# Patient Record
Sex: Male | Born: 1960 | Race: White | Hispanic: No | Marital: Married | Smoking: Never smoker
Health system: Southern US, Community
[De-identification: ages and names within clinical notes are randomized; demographics above are authoritative.]

## PROBLEM LIST (undated history)

## (undated) DIAGNOSIS — Z9889 Other specified postprocedural states: Secondary | ICD-10-CM

## (undated) DIAGNOSIS — F419 Anxiety disorder, unspecified: Secondary | ICD-10-CM

## (undated) DIAGNOSIS — I2510 Atherosclerotic heart disease of native coronary artery without angina pectoris: Secondary | ICD-10-CM

## (undated) DIAGNOSIS — G4730 Sleep apnea, unspecified: Secondary | ICD-10-CM

## (undated) DIAGNOSIS — I10 Essential (primary) hypertension: Secondary | ICD-10-CM

## (undated) DIAGNOSIS — E669 Obesity, unspecified: Secondary | ICD-10-CM

## (undated) DIAGNOSIS — M1990 Unspecified osteoarthritis, unspecified site: Secondary | ICD-10-CM

## (undated) DIAGNOSIS — E7800 Pure hypercholesterolemia, unspecified: Secondary | ICD-10-CM

## (undated) DIAGNOSIS — C801 Malignant (primary) neoplasm, unspecified: Secondary | ICD-10-CM

## (undated) DIAGNOSIS — R112 Nausea with vomiting, unspecified: Secondary | ICD-10-CM

## (undated) HISTORY — DX: Obesity, unspecified: E66.9

## (undated) HISTORY — DX: Essential (primary) hypertension: I10

## (undated) HISTORY — DX: Malignant (primary) neoplasm, unspecified: C80.1

## (undated) HISTORY — DX: Pure hypercholesterolemia, unspecified: E78.00

## (undated) HISTORY — DX: Sleep apnea, unspecified: G47.30

## (undated) HISTORY — PX: BARIATRIC SURGERY: SHX1103

## (undated) HISTORY — DX: Unspecified osteoarthritis, unspecified site: M19.90

## (undated) HISTORY — DX: Anxiety disorder, unspecified: F41.9

---

## 2003-01-06 HISTORY — PX: CARDIAC SURGERY: SHX584

## 2003-04-06 ENCOUNTER — Ambulatory Visit (HOSPITAL_COMMUNITY): Admission: RE | Admit: 2003-04-06 | Discharge: 2003-04-07 | Payer: Self-pay | Admitting: Cardiology

## 2004-10-28 ENCOUNTER — Ambulatory Visit (HOSPITAL_COMMUNITY): Admission: RE | Admit: 2004-10-28 | Discharge: 2004-10-28 | Payer: Self-pay | Admitting: *Deleted

## 2004-10-28 ENCOUNTER — Ambulatory Visit: Payer: Self-pay | Admitting: *Deleted

## 2004-10-30 ENCOUNTER — Ambulatory Visit: Payer: Self-pay | Admitting: Cardiology

## 2004-10-31 ENCOUNTER — Inpatient Hospital Stay (HOSPITAL_BASED_OUTPATIENT_CLINIC_OR_DEPARTMENT_OTHER): Admission: RE | Admit: 2004-10-31 | Discharge: 2004-10-31 | Payer: Self-pay | Admitting: Cardiology

## 2004-11-20 ENCOUNTER — Ambulatory Visit: Payer: Self-pay | Admitting: Cardiology

## 2008-09-05 DIAGNOSIS — I10 Essential (primary) hypertension: Secondary | ICD-10-CM | POA: Insufficient documentation

## 2008-09-05 DIAGNOSIS — I2510 Atherosclerotic heart disease of native coronary artery without angina pectoris: Secondary | ICD-10-CM | POA: Insufficient documentation

## 2008-09-05 DIAGNOSIS — E785 Hyperlipidemia, unspecified: Secondary | ICD-10-CM | POA: Insufficient documentation

## 2008-09-05 DIAGNOSIS — I499 Cardiac arrhythmia, unspecified: Secondary | ICD-10-CM | POA: Insufficient documentation

## 2014-10-17 ENCOUNTER — Ambulatory Visit (INDEPENDENT_AMBULATORY_CARE_PROVIDER_SITE_OTHER): Payer: BLUE CROSS/BLUE SHIELD | Admitting: *Deleted

## 2014-10-17 DIAGNOSIS — Z23 Encounter for immunization: Secondary | ICD-10-CM | POA: Diagnosis not present

## 2016-08-26 ENCOUNTER — Ambulatory Visit (INDEPENDENT_AMBULATORY_CARE_PROVIDER_SITE_OTHER): Payer: BLUE CROSS/BLUE SHIELD | Admitting: Orthopedic Surgery

## 2016-08-26 ENCOUNTER — Encounter (INDEPENDENT_AMBULATORY_CARE_PROVIDER_SITE_OTHER): Payer: Self-pay | Admitting: Orthopedic Surgery

## 2016-08-26 VITALS — BP 130/80 | HR 95 | Resp 12 | Ht 74.0 in | Wt >= 6400 oz

## 2016-08-26 DIAGNOSIS — M1712 Unilateral primary osteoarthritis, left knee: Secondary | ICD-10-CM | POA: Diagnosis not present

## 2016-08-26 DIAGNOSIS — M25562 Pain in left knee: Secondary | ICD-10-CM | POA: Diagnosis not present

## 2016-08-26 NOTE — Progress Notes (Signed)
Office Visit Note   Patient: Nathan Pope           Date of Birth: August 17, 1960           MRN: [id number] Visit Date: 08/26/2016              Requested by: No referring provider defined for this encounter. PCP: No primary care provider on file.   Assessment & Plan: Visit Diagnoses:  1. Acute pain of left knee   2. Unilateral primary osteoarthritis, left knee     Plan: #1: Corticosteroid injection to the left knee accomplished #2: Physical therapy as per protocol and strengthening #3: Return in 3 weeks and if he is still symptomatic plan for MRI scan after 09/26/2016.  Follow-Up Instructions: Return in about 3 weeks (around 09/16/2016).   Orders:  No orders of the defined types were placed in this encounter.  No orders of the defined types were placed in this encounter.     Procedures: Large Joint Inj Date/Time: 08/26/2016 7:01 PM Performed by: Biagio Borg D Authorized by: Biagio Borg D   Consent Given by:  Patient Timeout: prior to procedure the correct patient, procedure, and site was verified   Indications:  Pain and joint swelling Location:  Knee Site:  L knee Prep: patient was prepped and draped in usual sterile fashion   Needle Size:  25 G Needle Length:  1.5 inches Approach:  Anteromedial Ultrasound Guidance: No   Fluoroscopic Guidance: No   Arthrogram: No   Medications:  80 mg methylPREDNISolone acetate 40 MG/ML; 3 mL bupivacaine 0.5 %; 3 mL lidocaine 1 % Aspiration Attempted: No   Patient tolerance:  Patient tolerated the procedure well with no immediate complications     Clinical Data: No additional findings.   Subjective: Chief Complaint  Patient presents with  . Left Knee - Pain    Nathan Pope is a 56 y o that presents with chronic Left knee and a week ago he was climbing stairs heard/felt a "pop". [facility], XR obtained, pain meds for 3 days. Pain is noticeably better than last week.    Nathan Pope is a very pleasant 56 year old  white male who presents today with left knee pain. Apparently he has been told that he did have osteoarthritis in the knee. He was seen in August 8 by his local physician who told him he had osteoarthritis and placed him on a 6 to a prednisone Dosepak. Premature at good resolution of his symptoms. The next week on August 15 he was at work going up a flight of stairs when he is getting up to the last step and he felt a pop in his knee with rather excruciating pain. He went to the emergency room at [facility] where x-rays were also performed there was noted to have tricompartmental osteoarthritis with a small joint effusion with no acute osseous findings. Apparently an MRI was requested however his insurance company stated that he was unable to have an MRI to September 22. He does state that his knee pain is better than it was last week but still present. He is seen today for evaluation.        Review of Systems  Constitutional: Negative for fatigue.  HENT: Negative for hearing loss.   Respiratory: Negative for apnea, chest tightness and shortness of breath.   Cardiovascular: Negative for chest pain, palpitations and leg swelling.  Gastrointestinal: Negative for blood in stool, constipation and diarrhea.  Genitourinary: Negative for difficulty urinating.  Musculoskeletal: Negative for arthralgias, back pain, joint swelling, myalgias, neck pain and neck stiffness.  Neurological: Negative for weakness, numbness and headaches.  Hematological: Does not bruise/bleed easily.  Psychiatric/Behavioral: Negative for sleep disturbance. The patient is not nervous/anxious.      Objective: Vital Signs: Resp 12   Ht 6\' 2"  (1.88 m)   Wt (!) 400 lb (181.4 kg)   BMI 51.36 kg/m   Physical Exam  Constitutional: He is oriented to person, place, and time. He appears well-developed and well-nourished.  HENT:  Head: Normocephalic and atraumatic.  Eyes: Pupils are equal, round, and reactive to light. EOM  are normal.  Pulmonary/Chest: Effort normal.  Neurological: He is alert and oriented to person, place, and time.  Skin: Skin is warm and dry.  Psychiatric: He has a normal mood and affect. His behavior is normal. Judgment and thought content normal.    Ortho Exam Today he has range of motion from 0-110. A mild effusion. No warmth or erythema. He does have tenderness to palpation about the joint lines. Appears ligaments are stable. McMurray's does cause a little bit of pain in the knee. Smooth motion of the hip. Neurovascular intact distally.  Specialty Comments:  No specialty comments available.  Imaging: No results found.   PMFS History: Patient Active Problem List   Diagnosis Date Noted  . HYPERLIPIDEMIA 09/05/2008  . HYPERTENSION 09/05/2008  . CAD 09/05/2008  . IRREGULAR HEART RATE 09/05/2008   Past Medical History:  Diagnosis Date  . Arthritis   . Osteoarthritis     Family History  Problem Relation Age of Onset  . Diabetes Father   . Hypercalcemia Maternal Grandfather     Past Surgical History:  Procedure Laterality Date  . CARDIAC SURGERY  2005   stent   Social History   Occupational History  . Not on file.   Social History Main Topics  . Smoking status: Never Smoker  . Smokeless tobacco: Never Used  . Alcohol use 0.6 oz/week    1 Cans of beer per week  . Drug use: No  . Sexual activity: Not on file

## 2016-08-27 ENCOUNTER — Encounter (INDEPENDENT_AMBULATORY_CARE_PROVIDER_SITE_OTHER): Payer: Self-pay | Admitting: Orthopedic Surgery

## 2016-08-27 MED ORDER — LIDOCAINE HCL 1 % IJ SOLN
3.0000 mL | INTRAMUSCULAR | Status: AC | PRN
  Administered 2016-08-26: 3 mL

## 2016-08-27 MED ORDER — METHYLPREDNISOLONE ACETATE 40 MG/ML IJ SUSP
80.0000 mg | INTRAMUSCULAR | Status: AC | PRN
  Administered 2016-08-26: 80 mg

## 2016-08-27 MED ORDER — BUPIVACAINE HCL 0.5 % IJ SOLN
3.0000 mL | INTRAMUSCULAR | Status: AC | PRN
  Administered 2016-08-26: 3 mL via INTRA_ARTICULAR

## 2016-09-23 ENCOUNTER — Ambulatory Visit (INDEPENDENT_AMBULATORY_CARE_PROVIDER_SITE_OTHER): Payer: BLUE CROSS/BLUE SHIELD | Admitting: Orthopaedic Surgery

## 2016-09-30 ENCOUNTER — Ambulatory Visit (INDEPENDENT_AMBULATORY_CARE_PROVIDER_SITE_OTHER): Payer: BLUE CROSS/BLUE SHIELD | Admitting: Orthopaedic Surgery

## 2016-09-30 ENCOUNTER — Encounter (INDEPENDENT_AMBULATORY_CARE_PROVIDER_SITE_OTHER): Payer: Self-pay | Admitting: Orthopaedic Surgery

## 2016-09-30 VITALS — BP 126/82 | HR 84 | Resp 14 | Ht 74.0 in | Wt 395.0 lb

## 2016-09-30 DIAGNOSIS — G8929 Other chronic pain: Secondary | ICD-10-CM | POA: Diagnosis not present

## 2016-09-30 DIAGNOSIS — M25562 Pain in left knee: Secondary | ICD-10-CM | POA: Diagnosis not present

## 2016-09-30 NOTE — Progress Notes (Signed)
   Office Visit Note   Patient: Nathan Pope           Date of Birth: 1960/04/12           MRN: [id number] Visit Date: 09/30/2016              Requested by: No referring provider defined for this encounter. PCP: No primary care provider on file.   Assessment & Plan: Visit Diagnoses:  1. Chronic pain of left knee   osteoarthritis left knee. Good response to cortisone injection 68month ago  Plan: long discussion regarding diagnosis and teatment options over time. We've discussed follup cortisone injections, Visco supplementation exercises, weight loss and NSAIDs  Follow-Up Instructions: Return if symptoms worsen or fail to improve.   Orders:  No orders of the defined types were placed in this encounter.  No orders of the defined types were placed in this encounter.     Procedures: No procedures performed   Clinical Data: No additional findings.   Subjective: Chief Complaint  Patient presents with  . Left Knee - Pain    Nathan Pope is a 56 y o for a f/u of Left knee pain. Cortisone injection 08/26/16. Improvement with shot and PT.  eeling "a lot better" after cortisone injection left knee. Has been wearing kinetic tape  HPI  Review of Systems  Constitutional: Negative for fatigue.  HENT: Negative for hearing loss.   Respiratory: Negative for apnea, chest tightness and shortness of breath.   Cardiovascular: Negative for chest pain, palpitations and leg swelling.  Gastrointestinal: Negative for blood in stool, constipation and diarrhea.  Genitourinary: Negative for difficulty urinating.  Musculoskeletal: Negative for arthralgias, back pain, joint swelling, myalgias, neck pain and neck stiffness.  Neurological: Negative for weakness, numbness and headaches.  Hematological: Does not bruise/bleed easily.  Psychiatric/Behavioral: Negative for sleep disturbance. The patient is not nervous/anxious.      Objective: Vital Signs: BP 126/82   Pulse 84   Resp 14   Ht 6'  2" (1.88 m)   Wt (!) 395 lb (179.2 kg)   BMI 50.71 kg/m   Physical Exam  Ortho Examlarge knees. No effusion left knee. Predominantly medial joint pain that is "mild". Full extension and just over 100 of flexion. No popliteal pain or mass. No calf pain. No distal swelling. Neurovascular exam intact  Specialty Comments:  No specialty comments available.  Imaging: No results found.   PMFS History: Patient Active Problem List   Diagnosis Date Noted  . HYPERLIPIDEMIA 09/05/2008  . HYPERTENSION 09/05/2008  . CAD 09/05/2008  . IRREGULAR HEART RATE 09/05/2008   Past Medical History:  Diagnosis Date  . Arthritis   . Osteoarthritis     Family History  Problem Relation Age of Onset  . Diabetes Father   . Hypercalcemia Maternal Grandfather     Past Surgical History:  Procedure Laterality Date  . CARDIAC SURGERY  2005   stent   Social History   Occupational History  . Not on file.   Social History Main Topics  . Smoking status: Never Smoker  . Smokeless tobacco: Never Used  . Alcohol use 0.6 oz/week    1 Cans of beer per week  . Drug use: No  . Sexual activity: Not on file

## 2016-10-19 DIAGNOSIS — Z23 Encounter for immunization: Secondary | ICD-10-CM | POA: Diagnosis not present

## 2018-10-17 ENCOUNTER — Other Ambulatory Visit: Payer: Self-pay

## 2018-10-17 DIAGNOSIS — Z20822 Contact with and (suspected) exposure to covid-19: Secondary | ICD-10-CM

## 2018-10-18 LAB — NOVEL CORONAVIRUS, NAA: SARS-CoV-2, NAA: NOT DETECTED

## 2020-05-15 ENCOUNTER — Other Ambulatory Visit (HOSPITAL_COMMUNITY): Payer: Self-pay | Admitting: Surgery

## 2020-05-16 ENCOUNTER — Telehealth: Payer: Self-pay

## 2020-05-16 NOTE — Telephone Encounter (Signed)
NOTES ON FILE FROM  [facility] [phone], SENT REFERRAL TO SCHEDULING

## 2020-05-30 ENCOUNTER — Ambulatory Visit (HOSPITAL_COMMUNITY)
Admission: RE | Admit: 2020-05-30 | Discharge: 2020-05-30 | Disposition: A | Discharge status: Home / Self Care | Payer: BLUE CROSS/BLUE SHIELD | Source: Ambulatory Visit | Attending: Surgery | Admitting: Surgery

## 2020-06-04 ENCOUNTER — Ambulatory Visit (INDEPENDENT_AMBULATORY_CARE_PROVIDER_SITE_OTHER): Payer: BLUE CROSS/BLUE SHIELD | Admitting: Psychology

## 2020-06-04 DIAGNOSIS — F509 Eating disorder, unspecified: Secondary | ICD-10-CM | POA: Diagnosis not present

## 2020-06-04 NOTE — Progress Notes (Signed)
Cardiology Office Note:    Date:  06/10/2020   ID:  DARYN HICKS, DOB 1960-10-19, MRN [id number]  PCP:  Caryl Bis, MD   [facility] Providers Cardiologist:  None {  Referring MD: Felicie Morn, MD    History of Present Illness:    MADDYX WIECK is a 60 y.o. male with a hx of CAD with history of PCI in LAD, HTN, HLD, and obesity who was referred by Dr. Thermon Leyland for pre-operative evaluation prior to bariatric surgery.  Today, the patient states he feels well. He is planned for bariatric surgery and was hoping for cardiac clearance. Of note, he has a history of known CAD. Specifically, the patient states that in 2005 he was having worsening fatigue and SOB which prompted a stress test which revealed decreased perfusion anteriorly and he subsequently underwent coronary angiography with PCI in the LAD. Since that time he has been doing well with no recurrent chest pain. Has been tolerating medications without issues. Patient is active and able to walk 40 min per day without issues. Has difficulty with stairs due to knee pain, but can work in his yard for hours without needing to stop. Blood pressure well controlled on current medications. No HF symptoms.  Past Medical History:  Diagnosis Date  . Anxiety   . Arthritis   . Cancer ([place])   . Hypercholesterolemia   . Hypertension   . Obesity   . Osteoarthritis   . Sleep apnea     Past Surgical History:  Procedure Laterality Date  . CARDIAC SURGERY  2005   stent    Current Medications: Current Meds  Medication Sig  . aspirin EC 81 MG tablet Take 81 mg by mouth daily.  Marland Kitchen atorvastatin (LIPITOR) 80 MG tablet Take 80 mg by mouth daily.  Marland Kitchen CARTIA XT 240 MG 24 hr capsule Take 240 mg by mouth daily.  Marland Kitchen doxycycline (VIBRAMYCIN) 50 MG capsule Take 50 mg by mouth as needed.  . ezetimibe (ZETIA) 10 MG tablet Take 1 tablet (10 mg total) by mouth daily.  Marland Kitchen lisinopril (PRINIVIL,ZESTRIL) 20 MG tablet Take 20 mg by mouth  daily.     Allergies:   Patient has no known allergies.   Social History   Socioeconomic History  . Marital status: Married    Spouse name: Not on file  . Number of children: Not on file  . Years of education: Not on file  . Highest education level: Not on file  Occupational History  . Not on file  Tobacco Use  . Smoking status: Never Smoker  . Smokeless tobacco: Never Used  Vaping Use  . Vaping Use: Never used  Substance and Sexual Activity  . Alcohol use: Yes    Alcohol/week: 1.0 standard drink    Types: 1 Cans of beer per week  . Drug use: No  . Sexual activity: Not on file  Other Topics Concern  . Not on file  Social History Narrative  . Not on file   Social Determinants of Health   Financial Resource Strain: Not on file  Food Insecurity: Not on file  Transportation Needs: Not on file  Physical Activity: Not on file  Stress: Not on file  Social Connections: Not on file     Family History: The patient's family history includes Arthritis in his father; Diabetes in his father; Hypercalcemia in his maternal grandfather; Hypertension in his father.  ROS:   Please see the history of present illness.  Review of Systems  Constitutional: Negative for chills and fever.  HENT: Negative for sore throat.   Eyes: Negative for blurred vision.  Respiratory: Negative for shortness of breath.   Cardiovascular: Negative for chest pain, palpitations, orthopnea, claudication, leg swelling and PND.  Gastrointestinal: Negative for nausea and vomiting.  Genitourinary: Negative for hematuria.  Musculoskeletal: Positive for joint pain and myalgias.  Neurological: Negative for dizziness and loss of consciousness.  Psychiatric/Behavioral: Negative for substance abuse.    EKGs/Labs/Other Studies Reviewed:    The following studies were reviewed today: CXR 06/19/2020: FINDINGS: There is no evidence of acute infiltrate, pleural effusion or pneumothorax. The heart size and  mediastinal contours are within normal limits. There is moderate severity calcification of the aortic arch. Degenerative changes seen throughout the thoracic spine.  IMPRESSION: No active cardiopulmonary disease.  EKG:  EKG is  ordered today.  The ekg ordered today demonstrates NSR with HR 78  Recent Labs: No results found for requested labs within last 8760 hours.  Recent Lipid Panel No results found for: CHOL, TRIG, HDL, CHOLHDL, VLDL, LDLCALC, LDLDIRECT    Physical Exam:    VS:  BP 132/76   Pulse 78   Ht 6\' 2"  (1.88 m)   Wt (!) 416 lb 6.4 oz (188.9 kg)   SpO2 94%   BMI 53.46 kg/m     Wt Readings from Last 3 Encounters:  06/10/20 (!) 416 lb 6.4 oz (188.9 kg)  09/30/16 (!) 395 lb (179.2 kg)  08/26/16 (!) 400 lb (181.4 kg)     GEN:  Obese, comfortable HEENT: Normal NECK: No JVD; No carotid bruits CARDIAC: RRR, no murmurs, rubs, gallops RESPIRATORY:  Clear to auscultation without rales, wheezing or rhonchi  ABDOMEN: Obese, soft, non-tender, non-distended MUSCULOSKELETAL:  No edema; No deformity  SKIN: Warm and dry NEUROLOGIC:  Alert and oriented x 3 PSYCHIATRIC:  Normal affect   ASSESSMENT:    1. Pre-operative clearance   2. Medication management   3. Hyperlipidemia, unspecified hyperlipidemia type   4. Coronary artery disease involving native heart with angina pectoris, unspecified vessel or lesion type ([place])   5. Morbid obesity ([place])    PLAN:    In order of problems listed above:  #Pre-operative evaluation prior to bariatric surgery: Patient with history of CAD s/p PCI to LAD in 2005. No recurrent anginal symptoms. Patient is active and able to complete >4METs without symptoms with no indication for stress testing at this time. Walks everyday for 40 minutes. Denies chest pain, SOB, orthopnea, PND or LE edema. Compliant with medications and blood pressure well controlled. -No stress testing indicated prior to surgery -Continue ASA 81mg  daily -Continue  lipitor 80mg  daily -Start zetia 10mg  daily  #CAD s/p LAD PCI in 2005: Doing well with no anginal or HF symptoms. Compliant with all medications. -Check TTE for baseline as no TTE in our system post-PCI -Continue ASA 81mg  daily -Continue lipitor 80mg  daily -Continue lisinopril 20mg  daily -Start zetia 10mg  daily  #HTN: Well controlled and at goal <120/80s. -Continue dilt 240mg  daily -Continue lisinopril 20mg  daily  #HLD: LDL 94 which is above goal <70.  -Continue lipitor 80mg  daily -Start zetia 10mg  daily -Repeat lipids in 6 weeks  #Morbid Obesity with BMI 53: Planned for bariatric surgery as above. Continue diet and exercise. Patient very motivated to lose weight.  Medication Adjustments/Labs and Tests Ordered: Current medicines are reviewed at length with the patient today.  Concerns regarding medicines are outlined above.  Orders Placed This Encounter  Procedures  .  Lipid panel  . EKG 12-Lead  . ECHOCARDIOGRAM COMPLETE   Meds ordered this encounter  Medications  . ezetimibe (ZETIA) 10 MG tablet    Sig: Take 1 tablet (10 mg total) by mouth daily.    Dispense:  90 tablet    Refill:  2    Patient Instructions  Medication Instructions:   START TAKING ZETIA 10 MG BY MOUTH DAILY  *If you need a refill on your cardiac medications before your next appointment, please call your pharmacy*   Lab Work:  IN 6 [place] OFFICE--WILL [place] TIME--PLEASE COME FASTING TO THIS LAB APPOINTMENT  If you have labs (blood work) drawn today and your tests are completely normal, you will receive your results only by: Marland Kitchen MyChart Message (if you have MyChart) OR . A paper copy in the mail If you have any lab test that is abnormal or we need to change your treatment, we will call you to review the results.   Testing/Procedures:  Your physician has requested that you have an echocardiogram. Echocardiography is a painless test that uses sound waves to create  images of your heart. It provides your doctor with information about the size and shape of your heart and how well your heart's chambers and valves are working. This procedure takes approximately one hour. There are no restrictions for this procedure.   Follow-Up:  6-8 MONTHS IN THE OFFICE WITH DR. Johney Frame   Other Instructions  PLEASE HAVE YOUR BARIATRIC SURGEON FAX OUR OFFICE A CARDIAC/MEDICAL CLEARANCE FORM TO [phone] ATTN: DR. Staci Acosta THAT SHE CAN ADVISE ON THIS AND SEND BACK TO THEM FOR CLEARANCE OF SURGERY     Signed, Freada Bergeron, MD  06/10/2020 5:10 PM    [facility]

## 2020-06-10 ENCOUNTER — Other Ambulatory Visit: Payer: Self-pay

## 2020-06-10 ENCOUNTER — Encounter: Payer: Self-pay | Admitting: Cardiology

## 2020-06-10 ENCOUNTER — Ambulatory Visit: Payer: BLUE CROSS/BLUE SHIELD | Admitting: Cardiology

## 2020-06-10 VITALS — BP 132/76 | HR 78 | Ht 74.0 in | Wt >= 6400 oz

## 2020-06-10 DIAGNOSIS — I25119 Atherosclerotic heart disease of native coronary artery with unspecified angina pectoris: Secondary | ICD-10-CM

## 2020-06-10 DIAGNOSIS — Z79899 Other long term (current) drug therapy: Secondary | ICD-10-CM | POA: Diagnosis not present

## 2020-06-10 DIAGNOSIS — E785 Hyperlipidemia, unspecified: Secondary | ICD-10-CM

## 2020-06-10 DIAGNOSIS — Z01818 Encounter for other preprocedural examination: Secondary | ICD-10-CM

## 2020-06-10 MED ORDER — EZETIMIBE 10 MG PO TABS: 10.0000 mg | ORAL_TABLET | Freq: Every day | ORAL | 2 refills | Status: AC

## 2020-06-10 NOTE — Patient Instructions (Signed)
Medication Instructions:   START TAKING ZETIA 10 MG BY MOUTH DAILY  *If you need a refill on your cardiac medications before your next appointment, please call your pharmacy*   Lab Work:  IN [place] OFFICE--WILL [place] TIME--PLEASE COME FASTING TO THIS LAB APPOINTMENT  If you have labs (blood work) drawn today and your tests are completely normal, you will receive your results only by: Marland Kitchen MyChart Message (if you have MyChart) OR . A paper copy in the mail If you have any lab test that is abnormal or we need to change your treatment, we will call you to review the results.   Testing/Procedures:  Your physician has requested that you have an echocardiogram. Echocardiography is a painless test that uses sound waves to create images of your heart. It provides your doctor with information about the size and shape of your heart and how well your heart's chambers and valves are working. This procedure takes approximately one hour. There are no restrictions for this procedure.   Follow-Up:  6-8 MONTHS IN THE OFFICE WITH DR. Johney Frame   Other Instructions  PLEASE HAVE YOUR BARIATRIC SURGEON FAX OUR OFFICE A CARDIAC/MEDICAL CLEARANCE FORM TO [phone] ATTN: DR. Staci Acosta THAT SHE CAN ADVISE ON THIS AND SEND BACK TO THEM FOR CLEARANCE OF SURGERY

## 2020-06-25 ENCOUNTER — Other Ambulatory Visit: Payer: Self-pay

## 2020-06-25 ENCOUNTER — Ambulatory Visit: Payer: BLUE CROSS/BLUE SHIELD | Admitting: Psychology

## 2020-06-26 ENCOUNTER — Encounter: Payer: Self-pay | Admitting: Skilled Nursing Facility1

## 2020-06-26 ENCOUNTER — Encounter: Payer: BLUE CROSS/BLUE SHIELD | Attending: Surgery | Admitting: Skilled Nursing Facility1

## 2020-06-26 ENCOUNTER — Ambulatory Visit: Payer: Self-pay | Admitting: Skilled Nursing Facility1

## 2020-06-26 DIAGNOSIS — E669 Obesity, unspecified: Secondary | ICD-10-CM | POA: Insufficient documentation

## 2020-06-26 NOTE — Progress Notes (Signed)
Nutrition Assessment for Bariatric Surgery Medical Nutrition Therapy Appt Start Time: 2:30    End Time: 3:33  Patient was seen on 06/26/2020 for Pre-Operative Nutrition Assessment. Letter of approval faxed to [facility] bariatric surgery program coordinator on 06/26/2020.   Referral stated Supervised Weight Loss (SWL) visits needed: 0  Pt completed visits.   Pt has cleared nutrition requirements.     Planned surgery: sleeve gastrectomy  Pt expectation of surgery: top lose weight Pt expectation of dietitian: none identified     NUTRITION ASSESSMENT   Anthropometrics  Start weight at [facility]: 420 lbs (date: 06/26/2020)  Height: 74 in BMI: 54.04 kg/m2     Clinical  Medical hx: HTN, hyperlipidemia, sleep apnea Medications: see list  Labs:  Notable signs/symptoms:  Any previous deficiencies? No  Micronutrient Nutrition Focused Physical Exam: Hair: No issues observed Eyes: No issues observed Mouth: No issues observed Neck: No issues observed Nails: No issues observed Skin: No issues observed  Lifestyle & Dietary Hx  Pt arrives with the use of a cane due to recently twisting his knee.  pt states he wears his C-PAP every night.  Pt states he has been doing PT for his knee.  Pt states he has been working on reducing his carb intake and trying to be aware of his food choices.  Pt states he has a wonderful support system at home.   24-Hr Dietary Recall First Meal: cereal + 2 banana  Snack:  Second Meal: beans  Snack:  Third Meal: 3 eggs + 3 bacon + 2 toast + butter + tomato + salt + pepper Snack: ice cream twice a week Beverages: water, coffee + sugar free creamer, water + flavoring   Estimated Energy Needs Calories: 1800   NUTRITION DIAGNOSIS  Overweight/obesity ([place]-3.3) related to past poor dietary habits and physical inactivity as evidenced by patient w/ planned sleeve gastrectomy surgery following dietary guidelines for continued weight loss.     NUTRITION INTERVENTION  Nutrition counseling (C-1) and education (E-2) to facilitate bariatric surgery goals.  Educated pt on micronutrient deficiencies post surgery and strategies to mitigate that risk   Pre-Op Goals Reviewed with the Patient Track food and beverage intake (pen and paper, MyFitness Pal, Baritastic app, etc.) Make healthy food choices while monitoring portion sizes Consume 3 meals per day or try to eat every 3-5 hours Avoid concentrated sugars and fried foods Keep sugar & fat in the single digits per serving on food labels Practice CHEWING your food (aim for applesauce consistency) Practice not drinking 15 minutes before, during, and 30 minutes after each meal and snack Avoid all carbonated beverages (ex: soda, sparkling beverages)  Limit caffeinated beverages (ex: coffee, tea, energy drinks) Avoid all sugar-sweetened beverages (ex: regular soda, sports drinks)  Avoid alcohol  Aim for 64-100 ounces of FLUID daily (with at least half of fluid intake being plain water)  Aim for at least 60-80 grams of PROTEIN daily Look for a liquid protein source that contains ?15 g protein and ?5 g carbohydrate (ex: shakes, drinks, shots) Make a list of non-food related activities Physical activity is an important part of a healthy lifestyle so keep it moving! The goal is to reach 150 minutes of exercise per week, including cardiovascular and weight baring activity.  *Goals that are bolded indicate the pt would like to start working towards these  Handouts Provided Include  Bariatric Surgery handouts (Nutrition Visits, Pre-Op Goals, Protein Shakes, Vitamins & Minerals)  Learning Style & Readiness for Change Teaching  method utilized: Optician, dispensing  Demonstrated degree of understanding via: Teach Back  Readiness Level: Action Barriers to learning/adherence to lifestyle change: none identified       MONITORING & EVALUATION Dietary intake, weekly physical activity, body  weight, and pre-op goals reached at next nutrition visit.    Next Steps  Patient is to follow up at [facility] for Pre-Op Class >2 weeks before surgery for further nutrition education.   Pt has completed visits. No further supervised visits required/recomended

## 2020-07-09 ENCOUNTER — Ambulatory Visit (HOSPITAL_COMMUNITY): Payer: BLUE CROSS/BLUE SHIELD | Attending: Cardiology

## 2020-07-09 ENCOUNTER — Other Ambulatory Visit (HOSPITAL_COMMUNITY): Payer: BLUE CROSS/BLUE SHIELD

## 2020-07-09 ENCOUNTER — Other Ambulatory Visit: Payer: Self-pay

## 2020-07-09 DIAGNOSIS — Z01810 Encounter for preprocedural cardiovascular examination: Secondary | ICD-10-CM

## 2020-07-09 DIAGNOSIS — I25119 Atherosclerotic heart disease of native coronary artery with unspecified angina pectoris: Secondary | ICD-10-CM | POA: Insufficient documentation

## 2020-07-09 DIAGNOSIS — Z01818 Encounter for other preprocedural examination: Secondary | ICD-10-CM | POA: Diagnosis not present

## 2020-07-09 LAB — ECHOCARDIOGRAM COMPLETE
Area-P 1/2: 3.27 cm2
S' Lateral: 3.85 cm

## 2020-07-09 MED ORDER — PERFLUTREN LIPID MICROSPHERE
1.0000 mL | INTRAVENOUS | Status: AC | PRN
  Administered 2020-07-09: 1 mL via INTRAVENOUS

## 2020-07-12 ENCOUNTER — Ambulatory Visit: Payer: Self-pay | Admitting: Surgery

## 2020-07-22 ENCOUNTER — Other Ambulatory Visit: Payer: Self-pay

## 2020-07-22 ENCOUNTER — Other Ambulatory Visit: Payer: BLUE CROSS/BLUE SHIELD | Admitting: *Deleted

## 2020-07-22 DIAGNOSIS — I25119 Atherosclerotic heart disease of native coronary artery with unspecified angina pectoris: Secondary | ICD-10-CM

## 2020-07-22 DIAGNOSIS — Z79899 Other long term (current) drug therapy: Secondary | ICD-10-CM

## 2020-07-22 DIAGNOSIS — Z01818 Encounter for other preprocedural examination: Secondary | ICD-10-CM

## 2020-07-22 DIAGNOSIS — E785 Hyperlipidemia, unspecified: Secondary | ICD-10-CM

## 2020-07-22 LAB — LIPID PANEL
Chol/HDL Ratio: 3.5 ratio (ref 0.0–5.0)
Cholesterol, Total: 118 mg/dL (ref 100–199)
HDL: 34 mg/dL — ABNORMAL LOW (ref >39)
LDL Chol Calc (NIH): 59 mg/dL (ref 0–99)
Triglycerides: 144 mg/dL (ref 0–149)
VLDL Cholesterol Cal: 25 mg/dL (ref 5–40)

## 2020-07-25 ENCOUNTER — Ambulatory Visit: Payer: BLUE CROSS/BLUE SHIELD | Admitting: Cardiology

## 2020-08-09 ENCOUNTER — Encounter (HOSPITAL_COMMUNITY)
Admission: RE | Disposition: A | Discharge status: Home / Self Care | Payer: Self-pay | Source: Home / Self Care | Attending: Surgery

## 2020-08-09 ENCOUNTER — Ambulatory Visit (HOSPITAL_COMMUNITY)
Admission: RE | Admit: 2020-08-09 | Discharge: 2020-08-09 | Disposition: A | Discharge status: Home / Self Care | Payer: BLUE CROSS/BLUE SHIELD | Attending: Surgery | Admitting: Surgery

## 2020-08-09 ENCOUNTER — Ambulatory Visit (HOSPITAL_COMMUNITY): Payer: BLUE CROSS/BLUE SHIELD | Admitting: Certified Registered Nurse Anesthetist

## 2020-08-09 ENCOUNTER — Other Ambulatory Visit: Payer: Self-pay

## 2020-08-09 ENCOUNTER — Encounter (HOSPITAL_COMMUNITY): Payer: Self-pay | Admitting: Surgery

## 2020-08-09 DIAGNOSIS — K319 Disease of stomach and duodenum, unspecified: Secondary | ICD-10-CM | POA: Diagnosis not present

## 2020-08-09 DIAGNOSIS — Z79899 Other long term (current) drug therapy: Secondary | ICD-10-CM | POA: Diagnosis not present

## 2020-08-09 DIAGNOSIS — Z8249 Family history of ischemic heart disease and other diseases of the circulatory system: Secondary | ICD-10-CM | POA: Insufficient documentation

## 2020-08-09 DIAGNOSIS — Z7982 Long term (current) use of aspirin: Secondary | ICD-10-CM | POA: Diagnosis not present

## 2020-08-09 DIAGNOSIS — G4730 Sleep apnea, unspecified: Secondary | ICD-10-CM | POA: Diagnosis not present

## 2020-08-09 DIAGNOSIS — Z6841 Body Mass Index (BMI) 40.0 and over, adult: Secondary | ICD-10-CM | POA: Insufficient documentation

## 2020-08-09 DIAGNOSIS — K2980 Duodenitis without bleeding: Secondary | ICD-10-CM | POA: Diagnosis not present

## 2020-08-09 DIAGNOSIS — Z955 Presence of coronary angioplasty implant and graft: Secondary | ICD-10-CM | POA: Diagnosis not present

## 2020-08-09 DIAGNOSIS — E7800 Pure hypercholesterolemia, unspecified: Secondary | ICD-10-CM | POA: Diagnosis not present

## 2020-08-09 DIAGNOSIS — I10 Essential (primary) hypertension: Secondary | ICD-10-CM | POA: Diagnosis not present

## 2020-08-09 HISTORY — PX: ESOPHAGOGASTRODUODENOSCOPY: SHX5428

## 2020-08-09 HISTORY — PX: BIOPSY: SHX5522

## 2020-08-09 SURGERY — EGD (ESOPHAGOGASTRODUODENOSCOPY)
Anesthesia: Monitor Anesthesia Care

## 2020-08-09 MED ORDER — LIDOCAINE 2% (20 MG/ML) 5 ML SYRINGE
INTRAMUSCULAR | Status: DC | PRN
  Administered 2020-08-09: 50 mg via INTRAVENOUS

## 2020-08-09 MED ORDER — LACTATED RINGERS IV SOLN: INTRAVENOUS | Status: DC

## 2020-08-09 MED ORDER — PHENYLEPHRINE 40 MCG/ML (10ML) SYRINGE FOR IV PUSH (FOR BLOOD PRESSURE SUPPORT)
PREFILLED_SYRINGE | INTRAVENOUS | Status: DC | PRN
  Administered 2020-08-09 (×4): 120 ug via INTRAVENOUS

## 2020-08-09 MED ORDER — PROPOFOL 500 MG/50ML IV EMUL
INTRAVENOUS | Status: DC | PRN
  Administered 2020-08-09: 125 ug/kg/min via INTRAVENOUS

## 2020-08-09 MED ORDER — PROPOFOL 10 MG/ML IV BOLUS
INTRAVENOUS | Status: DC | PRN
  Administered 2020-08-09 (×2): 30 mg via INTRAVENOUS
  Administered 2020-08-09: 20 mg via INTRAVENOUS

## 2020-08-09 MED ORDER — PANTOPRAZOLE SODIUM 40 MG PO TBEC: 40.0000 mg | DELAYED_RELEASE_TABLET | Freq: Every day | ORAL | 3 refills | Status: DC

## 2020-08-09 MED ORDER — LACTATED RINGERS IV SOLN: INTRAVENOUS | Status: DC | PRN

## 2020-08-09 NOTE — Discharge Instructions (Signed)
YOU HAD AN ENDOSCOPIC PROCEDURE TODAY: Refer to the procedure report and other information in the discharge instructions given to you for any specific questions about what was found during the examination. If this information does not answer your questions, please call [facility] office at [phone] to clarify.   YOU SHOULD EXPECT: Some feelings of bloating in the abdomen. Passage of more gas than usual. Walking can help get rid of the air that was put into your GI tract during the procedure and reduce the bloating. If you had a lower endoscopy (such as a colonoscopy or flexible sigmoidoscopy) you may notice spotting of blood in your stool or on the toilet paper. Some abdominal soreness may be present for a day or two, also.  DIET: Your first meal following the procedure should be a light meal and then it is ok to progress to your normal diet. A half-sandwich or bowl of soup is an example of a good first meal. Heavy or fried foods are harder to digest and may make you feel nauseous or bloated. Drink plenty of fluids but you should avoid alcoholic beverages for 24 hours. If you had a esophageal dilation, please see attached instructions for diet.    ACTIVITY: Your care partner should take you home directly after the procedure. You should plan to take it easy, moving slowly for the rest of the day. You can resume normal activity the day after the procedure however YOU SHOULD NOT DRIVE, use power tools, machinery or perform tasks that involve climbing or major physical exertion for 24 hours (because of the sedation medicines used during the test).   SYMPTOMS TO REPORT IMMEDIATELY: A gastroenterologist can be reached at any hour. Please call [phone]  for any of the following symptoms:  Following lower endoscopy (colonoscopy, flexible sigmoidoscopy) Excessive amounts of blood in the stool  Significant tenderness, worsening of abdominal pains  Swelling of the abdomen that is new, acute   Fever of 100 or higher  Following upper endoscopy (EGD, EUS, ERCP, esophageal dilation) Vomiting of blood or coffee ground material  New, significant abdominal pain  New, significant chest pain or pain under the shoulder blades  Painful or persistently difficult swallowing  New shortness of breath  Black, tarry-looking or red, bloody stools  FOLLOW UP:  If any biopsies were taken you will be contacted by phone or by letter within the next 1-3 weeks. Call [phone]  if you have not heard about the biopsies in 3 weeks.  Please also call with any specific questions about appointments or follow up tests.

## 2020-08-09 NOTE — Transfer of Care (Signed)
Immediate Anesthesia Transfer of Care Note  Patient: Nathan Pope  Procedure(s) Performed: ESOPHAGOGASTRODUODENOSCOPY (EGD) BIOPSY  Patient Location: PACU  Anesthesia Type:MAC  Level of Consciousness: awake, alert , oriented and patient cooperative  Airway & Oxygen Therapy: Patient Spontanous Breathing and Patient connected to face mask  Post-op Assessment: Report given to RN and Post -op Vital signs reviewed and stable  Post vital signs: Reviewed and stable  Last Vitals:  Vitals Value Taken Time  BP 107/50 08/09/20 0908  Temp    Pulse 72 08/09/20 0909  Resp 20 08/09/20 0909  SpO2 96 % 08/09/20 0909  Vitals shown include unvalidated device data.  Last Pain:  Vitals:   08/09/20 0906  PainSc: 0-No pain         Complications: No notable events documented.

## 2020-08-09 NOTE — Op Note (Signed)
[facility] Patient Name: Nathan Pope Procedure Date: 08/09/2020 MRN: [id number] Attending MD: Felicie Morn ,  Date of Birth: 1960/12/25 CSN: [id number] Age: 60 Admit Type: Outpatient Procedure:                Upper GI endoscopy Indications:              Obesity due to excess calories Providers:                Felicie Morn, Carlyn Reichert, RN, Cherylynn Ridges, Technician, Dellie Catholic Referring MD:              Medicines:                Monitored Anesthesia Care Complications:            No immediate complications. Estimated Blood Loss:     Estimated blood loss: none. Procedure:                Pre-Anesthesia Assessment:                           - Prior to the procedure, a History and Physical                            was performed, and patient medications and                            allergies were reviewed. The patient is competent.                            The risks and benefits of the procedure and the                            sedation options and risks were discussed with the                            patient. All questions were answered and informed                            consent was obtained. Patient identification and                            proposed procedure were verified in the                            pre-procedure area. Mental Status Examination:                            alert and oriented. CV Examination: normal. ASA                            Grade Assessment: II - A patient with mild systemic  disease. After reviewing the risks and benefits,                            the patient was deemed in satisfactory condition to                            undergo the procedure. The anesthesia plan was to                            use monitored anesthesia care (MAC). Immediately                            prior to administration of medications, the patient                             was re-assessed for adequacy to receive sedatives.                            The heart rate, respiratory rate, oxygen                            saturations, blood pressure, adequacy of pulmonary                            ventilation, and response to care were monitored                            throughout the procedure. The physical status of                            the patient was re-assessed after the procedure.                           After obtaining informed consent, the endoscope was                            passed under direct vision. Throughout the                            procedure, the patient's blood pressure, pulse, and                            oxygen saturations were monitored continuously. The                            [id number]-H190 ([id number] Olympus endoscope was introduced                            through the mouth, and advanced to the second part                            of duodenum. The upper GI endoscopy was  accomplished without difficulty. The patient                            tolerated the procedure well. Scope In: Scope Out: Findings:      The examined esophagus was normal.      The gastroesophageal flap valve was visualized endoscopically and       classified as Hill Grade I (prominent fold, tight to endoscope).      The entire examined stomach was normal. Biopsies were taken with a cold       forceps for Helicobacter pylori testing. Verification of patient       identification for the specimen was done. Estimated blood loss was       minimal.      Diffuse moderate inflammation was found in the duodenal bulb. Biopsies       were taken with a cold forceps for histology. Impression:               - Normal esophagus.                           - Normal stomach. Biopsied.                           - Duodenitis. Biopsied. Moderate Sedation:      Moderate (conscious) sedation was personally administered by an        anesthesia professional. The following parameters were monitored: oxygen       saturation, heart rate, blood pressure, and response to care. Total       physician intraservice time was 15 minutes. Recommendation:           - Await pathology results.                           - Patient's anatomy appears appropriate for sleeve                            gastrectomy. I recommend starting a proton pump                            inhibitor prior to surgery due to the duodenitis                            found on today's EGD. I will remind the patient                            that he will need to avoid NSAIDS while healing                            from the sleeve gastrectomy, and he will continue                            protonix for at least 6 months. Procedure Code(s):        --- Professional ---                           [phone], Esophagogastroduodenoscopy, flexible,  transoral; with biopsy, single or multiple Diagnosis Code(s):        --- Professional ---                           K29.80, Duodenitis without bleeding                           E66.09, Other obesity due to excess calories CPT copyright 2019 American Medical Association. All rights reserved. The codes documented in this report are preliminary and upon coder review may  be revised to meet current compliance requirements. [place],  08/09/2020 9:14:25 AM This report has been signed electronically. Number of Addenda: 0

## 2020-08-09 NOTE — Anesthesia Postprocedure Evaluation (Signed)
Anesthesia Post Note  Patient: Nathan Pope  Procedure(s) Performed: ESOPHAGOGASTRODUODENOSCOPY (EGD) BIOPSY     Patient location during evaluation: PACU Anesthesia Type: MAC Level of consciousness: awake and alert Pain management: pain level controlled Vital Signs Assessment: post-procedure vital signs reviewed and stable Respiratory status: spontaneous breathing, nonlabored ventilation and respiratory function stable Cardiovascular status: blood pressure returned to baseline and stable Postop Assessment: no apparent nausea or vomiting Anesthetic complications: no   No notable events documented.  Last Vitals:  Vitals:   08/09/20 0916 08/09/20 0926  BP: (!) 102/50 (!) 89/54  Pulse: 73 63  Resp: 18 13  Temp:    SpO2: 92% 93%    Last Pain:  Vitals:   08/09/20 0926  TempSrc:   PainSc: 0-No pain                 Pervis Hocking

## 2020-08-09 NOTE — H&P (Signed)
Admitting Physician: Nickola Major Thong Feeny  Service: Bariatric surgery  CC: GERD  Subjective   HPI: Nathan Pope is an 60 y.o. male who is here for EGD prior to bariatric surgery  Past Medical History:  Diagnosis Date   Anxiety    Arthritis    Cancer ([place])    Hypercholesterolemia    Hypertension    Obesity    Osteoarthritis    Sleep apnea     Past Surgical History:  Procedure Laterality Date   CARDIAC SURGERY  2005   stent    Family History  Problem Relation Age of Onset   Diabetes Father    Arthritis Father    Hypertension Father    Hypercalcemia Maternal Grandfather     Social:  reports that he has never smoked. He has never used smokeless tobacco. He reports current alcohol use of about 1.0 standard drink of alcohol per week. He reports that he does not use drugs.  Allergies: No Known Allergies  Medications: Current Outpatient Medications  Medication Instructions   acetaminophen (TYLENOL) 1,000 mg, Oral, Every 6 hours PRN   aspirin EC 81 mg, Oral, Daily   atorvastatin (LIPITOR) 80 mg, Oral, Daily   Cartia XT 240 mg, Oral, Daily   doxycycline (MONODOX) 50 mg, Oral, Daily PRN, Rosacea   ezetimibe (ZETIA) 10 mg, Oral, Daily   Ibuprofen (ADVIL LIQUI-GELS MINIS) 400 mg, Oral, Every 8 hours PRN   lisinopril-hydrochlorothiazide (ZESTORETIC) 20-12.5 MG tablet 1 tablet, Oral, Daily    ROS - all of the below systems have been reviewed with the patient and positives are indicated with bold text General: chills, fever or night sweats Eyes: blurry vision or double vision ENT: epistaxis or sore throat Allergy/Immunology: itchy/watery eyes or nasal congestion Hematologic/Lymphatic: bleeding problems, blood clots or swollen lymph nodes Endocrine: temperature intolerance or unexpected weight changes Breast: new or changing breast lumps or nipple discharge Resp: cough, shortness of breath, or wheezing CV: chest pain or dyspnea on exertion GI: as per HPI GU:  dysuria, trouble voiding, or hematuria MSK: joint pain or joint stiffness Neuro: TIA or stroke symptoms Derm: pruritus and skin lesion changes Psych: anxiety and depression  Objective   PE Height '6\' 2"'$  (1.88 m), weight (!) 193.2 kg. Constitutional: NAD; conversant; no deformities Eyes: Moist conjunctiva; no lid lag; anicteric; PERRL Neck: Trachea midline; no thyromegaly Lungs: Normal respiratory effort; no tactile fremitus CV: RRR; no palpable thrills; no pitting edema GI: Abd Soft, non-tender; no palpable hepatosplenomegaly MSK: Normal range of motion of extremities; no clubbing/cyanosis Psychiatric: Appropriate affect; alert and oriented x3 Lymphatic: No palpable cervical or axillary lymphadenopathy  No results found for this or any previous visit (from the past 24 hour(s)).  Imaging Orders  No imaging studies ordered today     Assessment and Plan   Nathan Pope is a 60 year old male who presented for bariatric surgery consultation. He has morbid obesity (BMI 57), hyperlipidemia, and sleep apnea.  I reviewed the robotic gastric sleeve and robotic gastric bypass. I reviewed the risks, benefits, and alternatives to each procedure. I entered his basic demographic information into the [facility] bariatric surgery risk/benefit calculator in order to facilitate this discussion. After full discussion all questions answered patient's interest in pursuing a robotic sleeve gastrectomy. We will enter him in the pathway and begin with the following workup:  - Bloodwork - high cholesterol and HgA1C of 5.7 noted - Chest x-ray 05/30/20 with no active cardiopulmonary disease - EKG 06/10/20 with normal  sinus rhythm - Cardiac consultation as he has had a stent in the past - He is compliant with his CPAP and does not require a sleep study - Psychology evaluation - working with [facility] consult completed with [employer]  Today he presents for upper endoscopy with biopsy -I  explained my rationale for performing upper endoscopy in my preoperative bariatric patients. This gives me the opportunity to evaluate his lower esophagus for signs of esophagitis related to reflux. I also can evaluate for hiatal hernia. I will also take a biopsy of the stomach to evaluate for H. pylori which can be eradicated prior surgery. I described the procedure itself as well as its risks, benefits, and alternatives. As he finishes the above pathway, we will complete the EGD as well and then worked to schedule surgery.   Felicie Morn, MD  [facility] Use [email or web address] to contact on call provider

## 2020-08-09 NOTE — Anesthesia Procedure Notes (Signed)
Procedure Name: MAC Date/Time: 08/09/2020 8:48 AM Performed by: Claudia Desanctis, CRNA Pre-anesthesia Checklist: Patient identified, Emergency Drugs available, Suction available and Patient being monitored Patient Re-evaluated:Patient Re-evaluated prior to induction Oxygen Delivery Method: Simple face mask

## 2020-08-09 NOTE — Anesthesia Preprocedure Evaluation (Addendum)
Anesthesia Evaluation  Patient identified by MRN, date of birth, ID band Patient awake    Reviewed: Allergy & Precautions, NPO status , Patient's Chart, lab work & pertinent test results  Airway Mallampati: II  TM Distance: >3 FB Neck ROM: Full    Dental no notable dental hx. (+) Teeth Intact, Dental Advisory Given   Pulmonary sleep apnea and Continuous Positive Airway Pressure Ventilation ,    Pulmonary exam normal breath sounds clear to auscultation       Cardiovascular hypertension, Pt. on medications + CAD, + Cardiac Stents (2005) and +CHF (grade 1 diastolic dysfunction)  Normal cardiovascular exam Rhythm:Regular Rate:Normal  Echo 07/2020: 1. Left ventricular ejection fraction, by estimation, is 60 to 65%. The  left ventricle has normal function. The left ventricle has no regional  wall motion abnormalities. The left ventricular internal cavity size was  mildly dilated. There is mild  concentric left ventricular hypertrophy. Left ventricular diastolic  parameters are consistent with Grade I diastolic dysfunction (impaired  relaxation).  2. Right ventricular systolic function is normal. The right ventricular  size is normal.  3. The mitral valve is normal in structure. No evidence of mitral valve  regurgitation. No evidence of mitral stenosis.  4. The aortic valve is normal in structure. Aortic valve regurgitation is  not visualized. No aortic stenosis is present.  5. Aortic dilatation noted. There is borderline dilatation of the aortic  root, measuring 39 mm. There is borderline dilatation of the ascending  aorta, measuring 39 mm.  6. The inferior vena cava is normal in size with greater than 50%  respiratory variability, suggesting right atrial pressure of 3 mmHg.  7. Trivial pericardial effusion is present. The pericardial effusion is  circumferential.    Neuro/Psych PSYCHIATRIC DISORDERS Anxiety negative  neurological ROS     GI/Hepatic Neg liver ROS, GERD  Controlled,  Endo/Other  Morbid obesityBMI 55  Renal/GU negative Renal ROS  negative genitourinary   Musculoskeletal  (+) Arthritis , Osteoarthritis,    Abdominal (+) + obese,   Peds  Hematology negative hematology ROS (+)   Anesthesia Other Findings   Reproductive/Obstetrics negative OB ROS                           Anesthesia Physical Anesthesia Plan  ASA: 3  Anesthesia Plan: MAC   Post-op Pain Management:    Induction:   PONV Risk Score and Plan: 2 and Propofol infusion and TIVA  Airway Management Planned: Natural Airway and Simple Face Mask  Additional Equipment: None  Intra-op Plan:   Post-operative Plan:   Informed Consent: I have reviewed the patients History and Physical, chart, labs and discussed the procedure including the risks, benefits and alternatives for the proposed anesthesia with the patient or authorized representative who has indicated his/her understanding and acceptance.       Plan Discussed with: CRNA  Anesthesia Plan Comments:         Anesthesia Quick Evaluation

## 2020-08-12 LAB — SURGICAL PATHOLOGY

## 2020-08-13 ENCOUNTER — Encounter (HOSPITAL_COMMUNITY): Payer: Self-pay | Admitting: Surgery

## 2020-08-19 ENCOUNTER — Encounter: Payer: BLUE CROSS/BLUE SHIELD | Attending: Surgery | Admitting: Skilled Nursing Facility1

## 2020-08-19 ENCOUNTER — Other Ambulatory Visit: Payer: Self-pay

## 2020-08-19 DIAGNOSIS — E669 Obesity, unspecified: Secondary | ICD-10-CM | POA: Insufficient documentation

## 2020-08-20 NOTE — Progress Notes (Signed)
Pre-Operative Nutrition Class:    Patient was seen on 08/19/2020 for Pre-Operative Bariatric Surgery Education at the [facility]    Surgery date:  Surgery type: sleeve gastrectomy  Start weight at [facility]: 420 Weight today: 416.8 pounds  Samples given per MNT protocol. Patient educated on appropriate usage: Bariatric Advantage Multivitamin Lot # [id number] Exp: 08/23  Procare Calcium  Lot # [id number] Exp: 03/23  Bariatric Advantage protein powder Lot # [id number] Exp: 10/23  The following the learning objectives were met by the patient during this course: Identify Pre-Op Dietary Goals and will begin 2 weeks pre-operatively Identify appropriate sources of fluids and proteins  State protein recommendations and appropriate sources pre and post-operatively Identify Post-Operative Dietary Goals and will follow for 2 weeks post-operatively Identify appropriate multivitamin and calcium sources Describe the need for physical activity post-operatively and will follow MD recommendations State when to call healthcare provider regarding medication questions or post-operative complications When having a diagnosis of diabetes understanding hypoglycemia symptoms and the inclusion of 1 complex carbohydrate per meal  Handouts given during class include: Pre-Op Bariatric Surgery Diet Handout Protein Shake Handout Post-Op Bariatric Surgery Nutrition Handout BELT Program Information Flyer Support Group Information Flyer [facility] Bariatric Supplements Price List  Follow-Up Plan: Patient will follow-up at [facility] 2 weeks post operatively for diet advancement per MD.

## 2020-09-06 ENCOUNTER — Ambulatory Visit (INDEPENDENT_AMBULATORY_CARE_PROVIDER_SITE_OTHER): Payer: BLUE CROSS/BLUE SHIELD | Admitting: Psychology

## 2020-09-18 ENCOUNTER — Other Ambulatory Visit (HOSPITAL_COMMUNITY): Payer: Self-pay

## 2020-09-18 NOTE — Progress Notes (Signed)
Pt. Need orders for surgery.PAT on 09/23/20.

## 2020-09-18 NOTE — Patient Instructions (Addendum)
DUE TO COVID-19 ONLY ONE VISITOR IS ALLOWED TO COME WITH YOU AND STAY IN THE WAITING ROOM ONLY DURING PRE OP AND PROCEDURE.   **NO VISITORS ARE ALLOWED IN THE SHORT STAY AREA OR RECOVERY ROOM!!**  IF YOU WILL BE ADMITTED INTO THE HOSPITAL YOU ARE ALLOWED ONLY TWO SUPPORT PEOPLE DURING VISITATION HOURS ONLY (10AM -8PM)   The support person(s) may change daily. The support person(s) must pass our screening, gel in and out, and wear a mask at all times, including in the patient's room. Patients must also wear a mask when staff or their support person are in the room.  No visitors under the age of 35. Any visitor under the age of 21 must be accompanied by an adult.    COVID SWAB TESTING MUST BE COMPLETED ON: 10/11/20  **MUST PRESENT COMPLETED FORM AT TESTING SITE**    [facility] [place] (backside of the building) You are not required to quarantine, however you are required to wear a well-fitted mask when you are out and around people not in your household.  Hand Hygiene often Do NOT share personal items Notify your provider if you are in close contact with someone who has COVID or you develop fever 100.4 or greater, new onset of sneezing, cough, sore throat, shortness of breath or body aches.  [address], [address], must go inside of the hospital, NOT A DRIVE THRU!  (Must self quarantine after testing. Follow instructions on handout.)       Your procedure is scheduled on: 10/15/20   Report to [facility] Main  Entrance   Report to Short Stay at 5:15 AM   [facility])   Call this number if you have problems the morning of surgery [phone]   Do not eat food :After Midnight.   May have liquids until: 4:15 AM    day of surgery  CLEAR LIQUID DIET  Foods Allowed                                                                     Foods Excluded  Water, Black Coffee and tea, regular and decaf                              liquids that you cannot  Plain Jell-O in any flavor  (No red)                                           see through such as: Fruit ices (not with fruit pulp)                                     milk, soups, orange juice              Iced Popsicles (No red)  All solid food                                   Apple juices Sports drinks like Gatorade (No red) Lightly seasoned clear broth or consume(fat free) Sugar Sample Menu Breakfast                                Lunch                                     Supper Cranberry juice                    Beef broth                            Chicken broth Jell-O                                     Grape juice                           Apple juice Coffee or tea                        Jell-O                                      Popsicle                                                Coffee or tea                        Coffee or tea       Oral Hygiene is also important to reduce your risk of infection.                                    Remember - BRUSH YOUR TEETH THE MORNING OF SURGERY WITH YOUR REGULAR TOOTHPASTE   Do NOT smoke after Midnight   Take these medicines the morning of surgery with A SIP OF WATER: pantoprazole,cartia.  DO NOT TAKE ANY ORAL DIABETIC MEDICATIONS DAY OF YOUR SURGERY                              You may not have any metal on your body including hair pins, jewelry, and body piercing             Do not wear lotions, powders, perfumes/cologne, or deodorant               Men may shave face and neck.   Do not bring valuables to the hospital. [place].  Contacts, dentures or bridgework may not be worn into surgery.   Bring small overnight bag day of surgery.    Patients discharged the day of surgery will not be allowed to drive home.   Special Instructions: Bring a copy of your healthcare power of attorney and  living will documents         the day of surgery if you haven't scanned them in before.              Please read over the following fact sheets you were given: IF YOU HAVE QUESTIONS ABOUT YOUR PRE OP INSTRUCTIONS PLEASE CALL [phone]   [place] - Preparing for Surgery Before surgery, you can play an important role.  Because skin is not sterile, your skin needs to be as free of germs as possible.  You can reduce the number of germs on your skin by washing with CHG (chlorahexidine gluconate) soap before surgery.  CHG is an antiseptic cleaner which kills germs and bonds with the skin to continue killing germs even after washing. Please DO NOT use if you have an allergy to CHG or antibacterial soaps.  If your skin becomes reddened/irritated stop using the CHG and inform your nurse when you arrive at Short Stay. Do not shave (including legs and underarms) for at least 48 hours prior to the first CHG shower.  You may shave your face/neck. Please follow these instructions carefully:  1.  Shower with CHG Soap the night before surgery and the  morning of Surgery.  2.  If you choose to wash your hair, wash your hair first as usual with your  normal  shampoo.  3.  After you shampoo, rinse your hair and body thoroughly to remove the  shampoo.                           4.  Use CHG as you would any other liquid soap.  You can apply chg directly  to the skin and wash                       Gently with a scrungie or clean washcloth.  5.  Apply the CHG Soap to your body ONLY FROM THE NECK DOWN.   Do not use on face/ open                           Wound or open sores. Avoid contact with eyes, ears mouth and genitals (private parts).                       Wash face,  Genitals (private parts) with your normal soap.             6.  Wash thoroughly, paying special attention to the area where your surgery  will be performed.  7.  Thoroughly rinse your body with warm water from the neck down.  8.  DO NOT shower/wash  with your normal soap after using and rinsing off  the CHG Soap.                9.  Pat yourself dry with a clean towel.            10.  Wear clean pajamas.            11.  Place clean sheets on your bed the night of your first  shower and do not  sleep with pets. Day of Surgery : Do not apply any lotions/deodorants the morning of surgery.  Please wear clean clothes to the hospital/surgery center.  FAILURE TO FOLLOW THESE INSTRUCTIONS MAY RESULT IN THE CANCELLATION OF YOUR SURGERY PATIENT SIGNATURE_________________________________  NURSE SIGNATURE__________________________________  ________________________________________________________________________

## 2020-09-23 ENCOUNTER — Other Ambulatory Visit: Payer: Self-pay

## 2020-09-23 ENCOUNTER — Encounter (HOSPITAL_COMMUNITY)
Admission: RE | Admit: 2020-09-23 | Discharge: 2020-09-23 | Disposition: A | Discharge status: Home / Self Care | Payer: BLUE CROSS/BLUE SHIELD | Source: Ambulatory Visit | Attending: Surgery | Admitting: Surgery

## 2020-09-23 ENCOUNTER — Encounter (HOSPITAL_COMMUNITY): Payer: Self-pay

## 2020-09-23 DIAGNOSIS — Z01812 Encounter for preprocedural laboratory examination: Secondary | ICD-10-CM | POA: Insufficient documentation

## 2020-09-23 HISTORY — DX: Atherosclerotic heart disease of native coronary artery without angina pectoris: I25.10

## 2020-09-23 LAB — CBC
HCT: 44.1 % (ref 39.0–52.0)
Hemoglobin: 14.8 g/dL (ref 13.0–17.0)
MCH: 31.7 pg (ref 26.0–34.0)
MCHC: 33.6 g/dL (ref 30.0–36.0)
MCV: 94.4 fL (ref 80.0–100.0)
Platelets: 242 10*3/uL (ref 150–400)
RBC: 4.67 MIL/uL (ref 4.22–5.81)
RDW: 12.6 % (ref 11.5–15.5)
WBC: 5.5 10*3/uL (ref 4.0–10.5)
nRBC: 0 % (ref 0.0–0.2)

## 2020-09-23 LAB — BASIC METABOLIC PANEL
Anion gap: 10 (ref 5–15)
BUN: 16 mg/dL (ref 8–23)
CO2: 27 mmol/L (ref 22–32)
Calcium: 9.3 mg/dL (ref 8.9–10.3)
Chloride: 104 mmol/L (ref 98–111)
Creatinine, Ser: 0.8 mg/dL (ref 0.61–1.24)
GFR, Estimated: >60 mL/min (ref >60)
Glucose, Bld: 116 mg/dL — ABNORMAL HIGH (ref 70–99)
Potassium: 4 mmol/L (ref 3.5–5.1)
Sodium: 141 mmol/L (ref 135–145)

## 2020-09-23 NOTE — Progress Notes (Signed)
COVID Vaccine Completed: Yes Date COVID Vaccine completed: 04/2020. X 4 COVID vaccine manufacturer:   Moderna     PCP - Dr. Gar Ponto Cardiologist - Dr. Gwyndolyn Kaufman. LOV: 06/10/20  Chest x-ray - 05/31/20 EKG - 06/10/20 Stress Test -  ECHO - 07/09/20 Cardiac Cath -  Pacemaker/ICD device last checked:  Sleep Study - Yes CPAP - Yes  Fasting Blood Sugar -  Checks Blood Sugar _____ times a day  Blood Thinner Instructions: Aspirin Instructions: Pt. Got instruction,he does not remember,but he said he got the instructions at home. Last Dose:  Anesthesia review: Hx: HTN,CAD,OSA(CPAP).  Patient denies shortness of breath, fever, cough and chest pain at PAT appointment   Patient verbalized understanding of instructions that were given to them at the PAT appointment. Patient was also instructed that they will need to review over the PAT instructions again at home before surgery.

## 2020-10-11 ENCOUNTER — Other Ambulatory Visit: Payer: Self-pay | Admitting: Surgery

## 2020-10-11 LAB — SARS CORONAVIRUS 2 (TAT 6-24 HRS): SARS Coronavirus 2: NEGATIVE

## 2020-10-14 ENCOUNTER — Encounter (HOSPITAL_COMMUNITY): Payer: Self-pay | Admitting: Surgery

## 2020-10-14 NOTE — Anesthesia Preprocedure Evaluation (Addendum)
Anesthesia Evaluation  Patient identified by MRN, date of birth, ID band Patient awake    Reviewed: Allergy & Precautions, NPO status , Patient's Chart, lab work & pertinent test results, reviewed documented beta blocker date and time   Airway Mallampati: II  TM Distance: >3 FB Neck ROM: Full    Dental no notable dental hx. (+) Teeth Intact, Dental Advisory Given   Pulmonary sleep apnea and Continuous Positive Airway Pressure Ventilation ,    Pulmonary exam normal breath sounds clear to auscultation       Cardiovascular hypertension, Pt. on medications + CAD  Normal cardiovascular exam Rhythm:Regular Rate:Normal  EKG 06/10/20 NSR  Echo 07/09/20 1. Left ventricular ejection fraction, by estimation, is 60 to 65%. The left ventricle has normal function. The left ventricle has no regional wall motion abnormalities. The left ventricular internal cavity size was mildly dilated. There is mild concentric left ventricular hypertrophy. Left ventricular diastolic parameters are consistent with Grade I diastolic dysfunction (impaired relaxation).  2. Right ventricular systolic function is normal. The right ventricular  size is normal.  3. The mitral valve is normal in structure. No evidence of mitral valve regurgitation. No evidence of mitral stenosis.  4. The aortic valve is normal in structure. Aortic valve regurgitation is not visualized. No aortic stenosis is present.  5. Aortic dilatation noted. There is borderline dilatation of the aortic root, measuring 39 mm. There is borderline dilatation of the ascending aorta, measuring 39 mm.  6. The inferior vena cava is normal in size with greater than 50% respiratory variability, suggesting right atrial pressure of 3 mmHg.  7. Trivial pericardial effusion is present. The pericardial effusion is circumferential.    Neuro/Psych Anxiety negative neurological ROS     GI/Hepatic negative GI ROS,  Neg liver ROS,   Endo/Other  Morbid obesityHyperlipidemia  Renal/GU negative Renal ROS  negative genitourinary   Musculoskeletal  (+) Arthritis , Osteoarthritis,    Abdominal (+) + obese,   Peds  Hematology negative hematology ROS (+)   Anesthesia Other Findings   Reproductive/Obstetrics                            Anesthesia Physical Anesthesia Plan  ASA: 3  Anesthesia Plan: General   Post-op Pain Management:    Induction: Intravenous  PONV Risk Score and Plan: 4 or greater and Treatment may vary due to age or medical condition, Ondansetron, Droperidol and Midazolam  Airway Management Planned: Oral ETT  Additional Equipment:   Intra-op Plan:   Post-operative Plan: Extubation in OR  Informed Consent: I have reviewed the patients History and Physical, chart, labs and discussed the procedure including the risks, benefits and alternatives for the proposed anesthesia with the patient or authorized representative who has indicated his/her understanding and acceptance.     Dental advisory given  Plan Discussed with: CRNA and Anesthesiologist  Anesthesia Plan Comments:        Anesthesia Quick Evaluation

## 2020-10-15 ENCOUNTER — Encounter (HOSPITAL_COMMUNITY): Payer: Self-pay | Admitting: Surgery

## 2020-10-15 ENCOUNTER — Other Ambulatory Visit: Payer: Self-pay

## 2020-10-15 ENCOUNTER — Encounter (HOSPITAL_COMMUNITY)
Admission: RE | Disposition: A | Discharge status: Home / Self Care | Payer: Self-pay | Source: Home / Self Care | Attending: Surgery

## 2020-10-15 ENCOUNTER — Inpatient Hospital Stay (HOSPITAL_COMMUNITY): Payer: BLUE CROSS/BLUE SHIELD | Admitting: Anesthesiology

## 2020-10-15 ENCOUNTER — Inpatient Hospital Stay (HOSPITAL_COMMUNITY)
Admission: RE | Admit: 2020-10-15 | Discharge: 2020-10-16 | DRG: 621 | Disposition: A | Discharge status: Home / Self Care | Payer: BLUE CROSS/BLUE SHIELD | Attending: Surgery | Admitting: Surgery

## 2020-10-15 DIAGNOSIS — I10 Essential (primary) hypertension: Secondary | ICD-10-CM | POA: Diagnosis present

## 2020-10-15 DIAGNOSIS — I2510 Atherosclerotic heart disease of native coronary artery without angina pectoris: Secondary | ICD-10-CM | POA: Diagnosis present

## 2020-10-15 DIAGNOSIS — Z6841 Body Mass Index (BMI) 40.0 and over, adult: Secondary | ICD-10-CM

## 2020-10-15 DIAGNOSIS — F419 Anxiety disorder, unspecified: Secondary | ICD-10-CM | POA: Diagnosis present

## 2020-10-15 DIAGNOSIS — Z8249 Family history of ischemic heart disease and other diseases of the circulatory system: Secondary | ICD-10-CM

## 2020-10-15 DIAGNOSIS — E7800 Pure hypercholesterolemia, unspecified: Secondary | ICD-10-CM | POA: Diagnosis present

## 2020-10-15 DIAGNOSIS — M1990 Unspecified osteoarthritis, unspecified site: Secondary | ICD-10-CM | POA: Diagnosis present

## 2020-10-15 DIAGNOSIS — Z79899 Other long term (current) drug therapy: Secondary | ICD-10-CM

## 2020-10-15 DIAGNOSIS — K2980 Duodenitis without bleeding: Secondary | ICD-10-CM | POA: Diagnosis present

## 2020-10-15 DIAGNOSIS — Z7982 Long term (current) use of aspirin: Secondary | ICD-10-CM

## 2020-10-15 DIAGNOSIS — G4730 Sleep apnea, unspecified: Secondary | ICD-10-CM | POA: Diagnosis present

## 2020-10-15 HISTORY — PX: UPPER GI ENDOSCOPY: SHX6162

## 2020-10-15 LAB — COMPREHENSIVE METABOLIC PANEL
ALT: 34 U/L (ref 0–44)
AST: 26 U/L (ref 15–41)
Albumin: 4.3 g/dL (ref 3.5–5.0)
Alkaline Phosphatase: 56 U/L (ref 38–126)
Anion gap: 9 (ref 5–15)
BUN: 14 mg/dL (ref 8–23)
CO2: 24 mmol/L (ref 22–32)
Calcium: 9.2 mg/dL (ref 8.9–10.3)
Chloride: 102 mmol/L (ref 98–111)
Creatinine, Ser: 0.79 mg/dL (ref 0.61–1.24)
GFR, Estimated: >60 mL/min (ref >60)
Glucose, Bld: 113 mg/dL — ABNORMAL HIGH (ref 70–99)
Potassium: 3.7 mmol/L (ref 3.5–5.1)
Sodium: 135 mmol/L (ref 135–145)
Total Bilirubin: 1.1 mg/dL (ref 0.3–1.2)
Total Protein: 7.3 g/dL (ref 6.5–8.1)

## 2020-10-15 LAB — CBC WITH DIFFERENTIAL/PLATELET
Abs Immature Granulocytes: 0.01 10*3/uL (ref 0.00–0.07)
Basophils Absolute: 0.1 10*3/uL (ref 0.0–0.1)
Basophils Relative: 1 %
Eosinophils Absolute: 0.1 10*3/uL (ref 0.0–0.5)
Eosinophils Relative: 1 %
HCT: 44.8 % (ref 39.0–52.0)
Hemoglobin: 15.1 g/dL (ref 13.0–17.0)
Immature Granulocytes: 0 %
Lymphocytes Relative: 24 %
Lymphs Abs: 1.6 10*3/uL (ref 0.7–4.0)
MCH: 31.9 pg (ref 26.0–34.0)
MCHC: 33.7 g/dL (ref 30.0–36.0)
MCV: 94.5 fL (ref 80.0–100.0)
Monocytes Absolute: 0.6 10*3/uL (ref 0.1–1.0)
Monocytes Relative: 8 %
Neutro Abs: 4.4 10*3/uL (ref 1.7–7.7)
Neutrophils Relative %: 66 %
Platelets: 219 10*3/uL (ref 150–400)
RBC: 4.74 MIL/uL (ref 4.22–5.81)
RDW: 12.5 % (ref 11.5–15.5)
WBC: 6.7 10*3/uL (ref 4.0–10.5)
nRBC: 0 % (ref 0.0–0.2)

## 2020-10-15 LAB — ABO/RH: ABO/RH(D): B POS

## 2020-10-15 LAB — TYPE AND SCREEN
ABO/RH(D): B POS
Antibody Screen: NEGATIVE

## 2020-10-15 SURGERY — XI ROBOTIC GASTRIC SLEEVE RESECTION
Anesthesia: General | Site: Abdomen

## 2020-10-15 MED ORDER — ONDANSETRON HCL 4 MG/2ML IJ SOLN
INTRAMUSCULAR | Status: AC
  Filled 2020-10-15: qty 2

## 2020-10-15 MED ORDER — SODIUM CHLORIDE 0.9 % IV SOLN
2.0000 g | INTRAVENOUS | Status: AC
  Administered 2020-10-15: 2 g via INTRAVENOUS
  Filled 2020-10-15: qty 2

## 2020-10-15 MED ORDER — ORAL CARE MOUTH RINSE: 15.0000 mL | Freq: Once | OROMUCOSAL | Status: AC

## 2020-10-15 MED ORDER — BUPIVACAINE LIPOSOME 1.3 % IJ SUSP: 20.0000 mL | Freq: Once | INTRAMUSCULAR | Status: DC

## 2020-10-15 MED ORDER — ONDANSETRON HCL 4 MG/2ML IJ SOLN
4.0000 mg | INTRAMUSCULAR | Status: DC | PRN
  Administered 2020-10-15 (×2): 4 mg via INTRAVENOUS
  Filled 2020-10-15 (×2): qty 2

## 2020-10-15 MED ORDER — ENOXAPARIN (LOVENOX) PATIENT EDUCATION KIT
PACK | Freq: Once | Status: AC
  Filled 2020-10-15: qty 1

## 2020-10-15 MED ORDER — BUPIVACAINE LIPOSOME 1.3 % IJ SUSP
INTRAMUSCULAR | Status: DC | PRN
  Administered 2020-10-15: 20 mL

## 2020-10-15 MED ORDER — SCOPOLAMINE 1 MG/3DAYS TD PT72
MEDICATED_PATCH | TRANSDERMAL | Status: AC
  Administered 2020-10-15: 1.5 mg
  Filled 2020-10-15: qty 1

## 2020-10-15 MED ORDER — ROCURONIUM BROMIDE 10 MG/ML (PF) SYRINGE
PREFILLED_SYRINGE | INTRAVENOUS | Status: DC | PRN
  Administered 2020-10-15 (×2): 10 mg via INTRAVENOUS
  Administered 2020-10-15: 70 mg via INTRAVENOUS

## 2020-10-15 MED ORDER — DEXAMETHASONE SODIUM PHOSPHATE 10 MG/ML IJ SOLN
INTRAMUSCULAR | Status: AC
  Filled 2020-10-15: qty 1

## 2020-10-15 MED ORDER — PANTOPRAZOLE SODIUM 40 MG PO TBEC: 40.0000 mg | DELAYED_RELEASE_TABLET | Freq: Every day | ORAL | 0 refills | Status: DC

## 2020-10-15 MED ORDER — ENOXAPARIN SODIUM 30 MG/0.3ML IJ SOSY
30.0000 mg | PREFILLED_SYRINGE | Freq: Two times a day (BID) | INTRAMUSCULAR | Status: DC
  Administered 2020-10-15 – 2020-10-16 (×3): 30 mg via SUBCUTANEOUS
  Filled 2020-10-15 (×3): qty 0.3

## 2020-10-15 MED ORDER — ONDANSETRON HCL 4 MG/2ML IJ SOLN
INTRAMUSCULAR | Status: AC
  Administered 2020-10-15: 4 mg via INTRAVENOUS
  Filled 2020-10-15: qty 2

## 2020-10-15 MED ORDER — KETAMINE HCL 10 MG/ML IJ SOLN
INTRAMUSCULAR | Status: DC | PRN
  Administered 2020-10-15: 40 mg via INTRAVENOUS
  Administered 2020-10-15: 20 mg via INTRAVENOUS

## 2020-10-15 MED ORDER — PROPOFOL 10 MG/ML IV BOLUS
INTRAVENOUS | Status: DC | PRN
  Administered 2020-10-15: 200 mg via INTRAVENOUS

## 2020-10-15 MED ORDER — FENTANYL CITRATE (PF) 100 MCG/2ML IJ SOLN
INTRAMUSCULAR | Status: DC | PRN
  Administered 2020-10-15: 100 ug via INTRAVENOUS

## 2020-10-15 MED ORDER — DEXAMETHASONE SODIUM PHOSPHATE 10 MG/ML IJ SOLN
INTRAMUSCULAR | Status: DC | PRN
  Administered 2020-10-15: 10 mg via INTRAVENOUS

## 2020-10-15 MED ORDER — SUGAMMADEX SODIUM 500 MG/5ML IV SOLN
INTRAVENOUS | Status: DC | PRN
  Administered 2020-10-15: 500 mg via INTRAVENOUS

## 2020-10-15 MED ORDER — ATORVASTATIN CALCIUM 40 MG PO TABS
80.0000 mg | ORAL_TABLET | Freq: Every day | ORAL | Status: DC
  Administered 2020-10-15: 80 mg via ORAL
  Filled 2020-10-15: qty 2

## 2020-10-15 MED ORDER — EZETIMIBE 10 MG PO TABS
10.0000 mg | ORAL_TABLET | Freq: Every day | ORAL | Status: DC
  Administered 2020-10-16: 10 mg via ORAL
  Filled 2020-10-15: qty 1

## 2020-10-15 MED ORDER — ONDANSETRON HCL 4 MG/2ML IJ SOLN
INTRAMUSCULAR | Status: DC | PRN
  Administered 2020-10-15: 4 mg via INTRAVENOUS

## 2020-10-15 MED ORDER — GABAPENTIN 100 MG PO CAPS: 200.0000 mg | ORAL_CAPSULE | Freq: Two times a day (BID) | ORAL | 0 refills | Status: DC

## 2020-10-15 MED ORDER — FENTANYL CITRATE (PF) 100 MCG/2ML IJ SOLN
INTRAMUSCULAR | Status: AC
  Filled 2020-10-15: qty 2

## 2020-10-15 MED ORDER — ENOXAPARIN SODIUM 60 MG/0.6ML IJ SOSY: 60.0000 mg | PREFILLED_SYRINGE | Freq: Two times a day (BID) | INTRAMUSCULAR | 0 refills | Status: DC

## 2020-10-15 MED ORDER — OXYCODONE HCL 5 MG/5ML PO SOLN
5.0000 mg | Freq: Four times a day (QID) | ORAL | Status: DC | PRN
  Administered 2020-10-15 – 2020-10-16 (×3): 5 mg via ORAL
  Filled 2020-10-15 (×2): qty 5

## 2020-10-15 MED ORDER — ASPIRIN EC 81 MG PO TBEC
81.0000 mg | DELAYED_RELEASE_TABLET | Freq: Every day | ORAL | Status: DC
  Administered 2020-10-16: 81 mg via ORAL
  Filled 2020-10-15: qty 1

## 2020-10-15 MED ORDER — OXYCODONE HCL 5 MG PO TABS: 5.0000 mg | ORAL_TABLET | Freq: Four times a day (QID) | ORAL | 0 refills | Status: DC | PRN

## 2020-10-15 MED ORDER — DILTIAZEM HCL ER COATED BEADS 240 MG PO CP24
240.0000 mg | ORAL_CAPSULE | Freq: Every day | ORAL | Status: DC
  Administered 2020-10-16: 240 mg via ORAL

## 2020-10-15 MED ORDER — GABAPENTIN 300 MG PO CAPS
300.0000 mg | ORAL_CAPSULE | ORAL | Status: AC
  Administered 2020-10-15: 300 mg via ORAL
  Filled 2020-10-15: qty 1

## 2020-10-15 MED ORDER — ACETAMINOPHEN 500 MG PO TABS
1000.0000 mg | ORAL_TABLET | ORAL | Status: AC
  Administered 2020-10-15: 1000 mg via ORAL
  Filled 2020-10-15: qty 2

## 2020-10-15 MED ORDER — BUPIVACAINE-EPINEPHRINE 0.25% -1:200000 IJ SOLN
INTRAMUSCULAR | Status: DC | PRN
  Administered 2020-10-15: 30 mL

## 2020-10-15 MED ORDER — HYDROCHLOROTHIAZIDE 12.5 MG PO CAPS
12.5000 mg | ORAL_CAPSULE | Freq: Every day | ORAL | Status: DC
  Administered 2020-10-16: 12.5 mg via ORAL
  Filled 2020-10-15: qty 1

## 2020-10-15 MED ORDER — SUGAMMADEX SODIUM 500 MG/5ML IV SOLN
INTRAVENOUS | Status: AC
  Filled 2020-10-15: qty 5

## 2020-10-15 MED ORDER — PANTOPRAZOLE SODIUM 40 MG IV SOLR
40.0000 mg | Freq: Every day | INTRAVENOUS | Status: DC
  Administered 2020-10-15: 40 mg via INTRAVENOUS
  Filled 2020-10-15: qty 40

## 2020-10-15 MED ORDER — ACETAMINOPHEN 160 MG/5ML PO SOLN: 1000.0000 mg | Freq: Three times a day (TID) | ORAL | Status: DC

## 2020-10-15 MED ORDER — LACTATED RINGERS IV SOLN: INTRAVENOUS | Status: DC

## 2020-10-15 MED ORDER — STERILE WATER FOR IRRIGATION IR SOLN
Status: DC | PRN
  Administered 2020-10-15: 1000 mL

## 2020-10-15 MED ORDER — CHLORHEXIDINE GLUCONATE CLOTH 2 % EX PADS: 6.0000 | MEDICATED_PAD | Freq: Once | CUTANEOUS | Status: DC

## 2020-10-15 MED ORDER — MIDAZOLAM HCL 2 MG/2ML IJ SOLN
INTRAMUSCULAR | Status: AC
  Filled 2020-10-15: qty 2

## 2020-10-15 MED ORDER — ACETAMINOPHEN 500 MG PO TABS
1000.0000 mg | ORAL_TABLET | Freq: Three times a day (TID) | ORAL | Status: DC
  Administered 2020-10-15 – 2020-10-16 (×3): 1000 mg via ORAL
  Filled 2020-10-15 (×2): qty 2

## 2020-10-15 MED ORDER — CHLORHEXIDINE GLUCONATE 0.12 % MT SOLN
15.0000 mL | Freq: Once | OROMUCOSAL | Status: AC
  Administered 2020-10-15: 15 mL via OROMUCOSAL

## 2020-10-15 MED ORDER — ROCURONIUM BROMIDE 10 MG/ML (PF) SYRINGE
PREFILLED_SYRINGE | INTRAVENOUS | Status: AC
  Filled 2020-10-15: qty 10

## 2020-10-15 MED ORDER — ENOXAPARIN SODIUM 40 MG/0.4ML IJ SOSY
40.0000 mg | PREFILLED_SYRINGE | INTRAMUSCULAR | Status: AC
  Administered 2020-10-15: 40 mg via SUBCUTANEOUS
  Filled 2020-10-15: qty 0.4

## 2020-10-15 MED ORDER — 0.9 % SODIUM CHLORIDE (POUR BTL) OPTIME
TOPICAL | Status: DC | PRN
  Administered 2020-10-15: 1000 mL

## 2020-10-15 MED ORDER — LISINOPRIL-HYDROCHLOROTHIAZIDE 20-12.5 MG PO TABS: 1.0000 | ORAL_TABLET | Freq: Every day | ORAL | Status: DC

## 2020-10-15 MED ORDER — LACTATED RINGERS IR SOLN
Status: DC | PRN
  Administered 2020-10-15: 1000 mL

## 2020-10-15 MED ORDER — PROPOFOL 10 MG/ML IV BOLUS
INTRAVENOUS | Status: AC
  Filled 2020-10-15: qty 20

## 2020-10-15 MED ORDER — LIDOCAINE 2% (20 MG/ML) 5 ML SYRINGE
INTRAMUSCULAR | Status: DC | PRN
  Administered 2020-10-15: 50 mg via INTRAVENOUS

## 2020-10-15 MED ORDER — ONDANSETRON 4 MG PO TBDP: 4.0000 mg | ORAL_TABLET | Freq: Four times a day (QID) | ORAL | 0 refills | Status: DC | PRN

## 2020-10-15 MED ORDER — ONDANSETRON HCL 4 MG/2ML IJ SOLN: 4.0000 mg | Freq: Once | INTRAMUSCULAR | Status: AC | PRN

## 2020-10-15 MED ORDER — HYDROMORPHONE HCL 1 MG/ML IJ SOLN
INTRAMUSCULAR | Status: AC
  Administered 2020-10-15: 0.25 mg via INTRAVENOUS
  Filled 2020-10-15: qty 1

## 2020-10-15 MED ORDER — LISINOPRIL 20 MG PO TABS
20.0000 mg | ORAL_TABLET | Freq: Every day | ORAL | Status: DC
  Administered 2020-10-16: 20 mg via ORAL
  Filled 2020-10-15: qty 1

## 2020-10-15 MED ORDER — BUPIVACAINE LIPOSOME 1.3 % IJ SUSP
INTRAMUSCULAR | Status: AC
  Filled 2020-10-15: qty 20

## 2020-10-15 MED ORDER — ENSURE MAX PROTEIN PO LIQD
2.0000 [oz_av] | ORAL | Status: DC
  Administered 2020-10-16 (×4): 2 [oz_av] via ORAL

## 2020-10-15 MED ORDER — MIDAZOLAM HCL 5 MG/5ML IJ SOLN
INTRAMUSCULAR | Status: DC | PRN
  Administered 2020-10-15: 2 mg via INTRAVENOUS

## 2020-10-15 MED ORDER — BUPIVACAINE-EPINEPHRINE (PF) 0.25% -1:200000 IJ SOLN
INTRAMUSCULAR | Status: AC
  Filled 2020-10-15: qty 30

## 2020-10-15 MED ORDER — KETAMINE HCL 10 MG/ML IJ SOLN
INTRAMUSCULAR | Status: AC
  Filled 2020-10-15: qty 1

## 2020-10-15 MED ORDER — HYDROMORPHONE HCL 1 MG/ML IJ SOLN
0.2500 mg | INTRAMUSCULAR | Status: DC | PRN
  Administered 2020-10-15: 0.25 mg via INTRAVENOUS
  Administered 2020-10-15: 0.5 mg via INTRAVENOUS

## 2020-10-15 MED ORDER — MORPHINE SULFATE (PF) 4 MG/ML IV SOLN
1.0000 mg | INTRAVENOUS | Status: DC | PRN
  Filled 2020-10-15: qty 1

## 2020-10-15 MED ORDER — APREPITANT 40 MG PO CAPS
40.0000 mg | ORAL_CAPSULE | ORAL | Status: AC
  Administered 2020-10-15: 40 mg via ORAL
  Filled 2020-10-15: qty 1

## 2020-10-15 MED ORDER — PHENYLEPHRINE 40 MCG/ML (10ML) SYRINGE FOR IV PUSH (FOR BLOOD PRESSURE SUPPORT)
PREFILLED_SYRINGE | INTRAVENOUS | Status: DC | PRN
  Administered 2020-10-15 (×2): 120 ug via INTRAVENOUS

## 2020-10-15 MED ORDER — ACETAMINOPHEN 500 MG PO TABS: 1000.0000 mg | ORAL_TABLET | Freq: Three times a day (TID) | ORAL | 0 refills | Status: AC

## 2020-10-15 SURGICAL SUPPLY — 77 items
ADH SKN CLS APL DERMABOND .7 (GAUZE/BANDAGES/DRESSINGS) ×2
APL PRP STRL LF DISP 70% ISPRP (MISCELLANEOUS) ×4
APPLIER CLIP 5 13 M/L LIGAMAX5 (MISCELLANEOUS)
APPLIER CLIP ROT 10 11.4 M/L (STAPLE)
APR CLP MED LRG 11.4X10 (STAPLE)
APR CLP MED LRG 5 ANG JAW (MISCELLANEOUS)
BAG COUNTER SPONGE SURGICOUNT (BAG) ×3 IMPLANT
BAG SPNG CNTER NS LX DISP (BAG) ×2
BLADE SURG SZ11 CARB STEEL (BLADE) ×3 IMPLANT
CANNULA REDUC XI 12-8 STAPL (CANNULA) ×3
CANNULA REDUCER 12-8 DVNC XI (CANNULA) ×2 IMPLANT
CHLORAPREP W/TINT 26 (MISCELLANEOUS) ×6 IMPLANT
CLIP APPLIE 5 13 M/L LIGAMAX5 (MISCELLANEOUS) IMPLANT
CLIP APPLIE ROT 10 11.4 M/L (STAPLE) IMPLANT
COVER SURGICAL LIGHT HANDLE (MISCELLANEOUS) ×3 IMPLANT
COVER TIP SHEARS 8 DVNC (MISCELLANEOUS) IMPLANT
COVER TIP SHEARS 8MM DA VINCI (MISCELLANEOUS)
DECANTER SPIKE VIAL GLASS SM (MISCELLANEOUS) ×3 IMPLANT
DERMABOND ADVANCED (GAUZE/BANDAGES/DRESSINGS) ×1
DERMABOND ADVANCED .7 DNX12 (GAUZE/BANDAGES/DRESSINGS) ×2 IMPLANT
DRAPE ARM DVNC X/XI (DISPOSABLE) ×8 IMPLANT
DRAPE COLUMN DVNC XI (DISPOSABLE) ×2 IMPLANT
DRAPE DA VINCI XI ARM (DISPOSABLE) ×12
DRAPE DA VINCI XI COLUMN (DISPOSABLE) ×3
ELECT REM PT RETURN 15FT ADLT (MISCELLANEOUS) ×3 IMPLANT
GAUZE 4X4 16PLY ~~LOC~~+RFID DBL (SPONGE) ×3 IMPLANT
GLOVE SURG ENC MOIS LTX SZ7.5 (GLOVE) ×6 IMPLANT
GLOVE SURG UNDER LTX SZ8 (GLOVE) ×6 IMPLANT
GOWN STRL REUS W/TWL XL LVL3 (GOWN DISPOSABLE) ×6 IMPLANT
GRASPER SUT TROCAR 14GX15 (MISCELLANEOUS) ×3 IMPLANT
HEMOSTAT SNOW SURGICEL 2X4 (HEMOSTASIS) IMPLANT
IRRIG SUCT STRYKERFLOW 2 WTIP (MISCELLANEOUS) ×3
IRRIGATION SUCT STRKRFLW 2 WTP (MISCELLANEOUS) ×2 IMPLANT
KIT BASIN OR (CUSTOM PROCEDURE TRAY) ×3 IMPLANT
LUBRICANT JELLY K Y 4OZ (MISCELLANEOUS) IMPLANT
MARKER SKIN DUAL TIP RULER LAB (MISCELLANEOUS) ×3 IMPLANT
NDL SPNL 18GX3.5 QUINCKE PK (NEEDLE) ×2 IMPLANT
NEEDLE SPNL 18GX3.5 QUINCKE PK (NEEDLE) ×3 IMPLANT
PACK CARDIOVASCULAR III (CUSTOM PROCEDURE TRAY) ×3 IMPLANT
PAD POSITIONING PINK XL (MISCELLANEOUS) ×3 IMPLANT
RELOAD STAPLE 60 2.5 WHT DVNC (STAPLE) IMPLANT
RELOAD STAPLE 60 3.5 BLU DVNC (STAPLE) IMPLANT
RELOAD STAPLE 60 4.3 GRN DVNC (STAPLE) IMPLANT
RELOAD STAPLER 2.5X60 WHT DVNC (STAPLE) ×6 IMPLANT
RELOAD STAPLER 3.5X60 BLU DVNC (STAPLE) ×2 IMPLANT
RELOAD STAPLER 4.3X60 GRN DVNC (STAPLE) ×2 IMPLANT
SCISSORS LAP 5X35 DISP (ENDOMECHANICALS) IMPLANT
SEAL CANN UNIV 5-8 DVNC XI (MISCELLANEOUS) ×6 IMPLANT
SEAL XI 5MM-8MM UNIVERSAL (MISCELLANEOUS) ×9
SEALER SYNCHRO 8 IS4000 DV (MISCELLANEOUS) ×3
SEALER SYNCHRO 8 IS4000 DVNC (MISCELLANEOUS) ×2 IMPLANT
SLEEVE GASTRECTOMY 40FR VISIGI (MISCELLANEOUS) ×1 IMPLANT
SOL ANTI FOG 6CC (MISCELLANEOUS) ×2 IMPLANT
SOLUTION ANTI FOG 6CC (MISCELLANEOUS) ×1
SOLUTION ELECTROLUBE (MISCELLANEOUS) ×3 IMPLANT
STAPLER 60 DA VINCI SURE FORM (STAPLE) ×3
STAPLER 60 SUREFORM DVNC (STAPLE) ×2 IMPLANT
STAPLER CANNULA SEAL DVNC XI (STAPLE) ×2 IMPLANT
STAPLER CANNULA SEAL XI (STAPLE) ×3
STAPLER RELOAD 2.5X60 WHITE (STAPLE) ×9
STAPLER RELOAD 2.5X60 WHT DVNC (STAPLE) ×6
STAPLER RELOAD 3.5X60 BLU DVNC (STAPLE) ×2
STAPLER RELOAD 3.5X60 BLUE (STAPLE) ×3
STAPLER RELOAD 4.3X60 GREEN (STAPLE) ×3
STAPLER RELOAD 4.3X60 GRN DVNC (STAPLE) ×2
SUT MNCRL AB 4-0 PS2 18 (SUTURE) ×3 IMPLANT
SUT VIC AB 0 CT1 27 (SUTURE) ×3
SUT VIC AB 0 CT1 27XBRD ANBCTR (SUTURE) ×2 IMPLANT
SUT VIC AB 2-0 SH 27 (SUTURE) ×3
SUT VIC AB 2-0 SH 27XBRD (SUTURE) IMPLANT
SYR 20ML LL LF (SYRINGE) ×3 IMPLANT
TOWEL OR 17X26 10 PK STRL BLUE (TOWEL DISPOSABLE) ×3 IMPLANT
TRAY FOLEY MTR SLVR 16FR STAT (SET/KITS/TRAYS/PACK) IMPLANT
TROCAR ADV FIXATION 12X100MM (TROCAR) ×3 IMPLANT
TROCAR Z-THREAD FIOS 5X100MM (TROCAR) ×3 IMPLANT
TUBE CALIBRATION LAPBAND (TUBING) ×3 IMPLANT
TUBING INSUFFLATION 10FT LAP (TUBING) ×3 IMPLANT

## 2020-10-15 NOTE — Progress Notes (Signed)

## 2020-10-15 NOTE — Discharge Summary (Signed)
Patient ID: Nathan Pope [id number] 60 y.o. 30-Oct-1960  10/15/2020  Discharge date and time: 10/16/2020  Admitting Physician: Louanna Raw  Discharge Physician: Clovis Riley  Admission Diagnoses: Morbid obesity with BMI of 50.0-59.9, adult ([place]) [E66.01, Z68.43] Patient Active Problem List   Diagnosis Date Noted   Morbid obesity with BMI of 50.0-59.9, adult ([place]) 10/15/2020   HYPERLIPIDEMIA 09/05/2008   HYPERTENSION 09/05/2008   CAD 09/05/2008   IRREGULAR HEART RATE 09/05/2008     Discharge Diagnoses: Morbid obesity Patient Active Problem List   Diagnosis Date Noted   Morbid obesity with BMI of 50.0-59.9, adult ([place]) 10/15/2020   HYPERLIPIDEMIA 09/05/2008   HYPERTENSION 09/05/2008   CAD 09/05/2008   IRREGULAR HEART RATE 09/05/2008    Operations: Procedure(s): XI ROBOTIC GASTRIC SLEEVE RESECTION UPPER GI ENDOSCOPY  Admission Condition: good  Discharged Condition: good  Indication for Admission: Morbid obesity  Hospital Course: Mr. Carignan presented for elective robotic sleeve gastrectomy.  He recovered well and was discharged.  His postoperative DVT risk was calculated to be 0.99% so he was placed on 4 weeks subcutaneous Lovenox to reduce this risk.  Consults: None  Significant Diagnostic Studies: none  Treatments: surgery: as above  Disposition: Home  Patient Instructions:  Allergies as of 10/16/2020   No Known Allergies      Medication List     TAKE these medications    acetaminophen 500 MG tablet Commonly known as: TYLENOL Take 1,000 mg by mouth every 6 (six) hours as needed for headache. What changed: Another medication with the same name was added. Make sure you understand how and when to take each.   acetaminophen 500 MG tablet Commonly known as: TYLENOL Take 2 tablets (1,000 mg total) by mouth every 8 (eight) hours for 5 days. What changed: You were already taking a medication with the same name, and this prescription was  added. Make sure you understand how and when to take each.   aspirin EC 81 MG tablet Take 81 mg by mouth daily.   atorvastatin 80 MG tablet Commonly known as: LIPITOR Take 80 mg by mouth daily.   Cartia XT 240 MG 24 hr capsule Generic drug: diltiazem Take 240 mg by mouth daily.   doxycycline 50 MG capsule Commonly known as: MONODOX Take 50 mg by mouth daily as needed (rosacea).   enoxaparin 60 MG/0.6ML injection Commonly known as: LOVENOX Inject 0.6 mLs (60 mg total) into the skin every 12 (twelve) hours for 28 days.   ezetimibe 10 MG tablet Commonly known as: ZETIA Take 1 tablet (10 mg total) by mouth daily.   gabapentin 100 MG capsule Commonly known as: NEURONTIN Take 2 capsules (200 mg total) by mouth every 12 (twelve) hours.   lisinopril-hydrochlorothiazide 20-12.5 MG tablet Commonly known as: ZESTORETIC Take 1 tablet by mouth daily.   ondansetron 4 MG disintegrating tablet Commonly known as: ZOFRAN-ODT Take 1 tablet (4 mg total) by mouth every 6 (six) hours as needed for nausea or vomiting.   oxyCODONE 5 MG immediate release tablet Commonly known as: Oxy IR/ROXICODONE Take 1 tablet (5 mg total) by mouth every 6 (six) hours as needed for severe pain.   pantoprazole 40 MG tablet Commonly known as: Protonix Take 1 tablet (40 mg total) by mouth daily. What changed: Another medication with the same name was added. Make sure you understand how and when to take each.   pantoprazole 40 MG tablet Commonly known as: PROTONIX Take 1 tablet (40 mg total) by mouth daily. What  changed: You were already taking a medication with the same name, and this prescription was added. Make sure you understand how and when to take each.        Activity: no heavy lifting for 4 weeks Diet:  bariatric protocol Wound Care: keep wound clean and dry  Follow-up:  With Dr. Thermon Leyland in 4 weeks.  Signed: Lorraine Lax, Bariatric, & Minimally Invasive Surgery [facility], [place]   10/16/2020, 8:23 AM

## 2020-10-15 NOTE — Progress Notes (Signed)
Patient ambulated from stretcher to chair and sat in chair for over an hour. Pt complained of nausea that subsided with medication. Pt demonstrated excellent use of IS reaching 2500 each time. Pt drinking water. Lovenox kit was given and explained to wife and patient and wife took kit home with her.

## 2020-10-15 NOTE — H&P (Signed)
Admitting Physician: Nickola Major Tamla Winkels  Service: Bariatric surgery  CC: Obesity  Subjective   HPI: KENNIEL BERGSMA is an 60 y.o. male who is here for elective robotic sleeve gastrectomy  Past Medical History:  Diagnosis Date   Anxiety    Arthritis    Cancer ([place])    Coronary artery disease    Hypercholesterolemia    Hypertension    Obesity    Osteoarthritis    Sleep apnea     Past Surgical History:  Procedure Laterality Date   BIOPSY  08/09/2020   Procedure: BIOPSY;  Surgeon: Felicie Morn, MD;  Location: [facility] ENDOSCOPY;  Service: General;;   CARDIAC SURGERY  2005   stent   ESOPHAGOGASTRODUODENOSCOPY N/A 08/09/2020   Procedure: ESOPHAGOGASTRODUODENOSCOPY (EGD);  Surgeon: Felicie Morn, MD;  Location: [facility];  Service: General;  Laterality: N/A;    Family History  Problem Relation Age of Onset   Diabetes Father    Arthritis Father    Hypertension Father    Hypercalcemia Maternal Grandfather     Social:  reports that he has never smoked. He has never used smokeless tobacco. He reports that he does not currently use alcohol after a past usage of about 1.0 standard drink per week. He reports that he does not use drugs.  Allergies: No Known Allergies  Medications: Current Outpatient Medications  Medication Instructions   acetaminophen (TYLENOL) 1,000 mg, Oral, Every 6 hours PRN   aspirin EC 81 mg, Oral, Daily   atorvastatin (LIPITOR) 80 mg, Oral, Daily   Cartia XT 240 mg, Oral, Daily   doxycycline (MONODOX) 50 mg, Oral, Daily PRN   ezetimibe (ZETIA) 10 mg, Oral, Daily   lisinopril-hydrochlorothiazide (ZESTORETIC) 20-12.5 MG tablet 1 tablet, Oral, Daily   pantoprazole (PROTONIX) 40 mg, Oral, Daily    ROS - all of the below systems have been reviewed with the patient and positives are indicated with bold text General: chills, fever or night sweats Eyes: blurry vision or double vision ENT: epistaxis or sore throat Allergy/Immunology:  itchy/watery eyes or nasal congestion Hematologic/Lymphatic: bleeding problems, blood clots or swollen lymph nodes Endocrine: temperature intolerance or unexpected weight changes Breast: new or changing breast lumps or nipple discharge Resp: cough, shortness of breath, or wheezing CV: chest pain or dyspnea on exertion GI: as per HPI GU: dysuria, trouble voiding, or hematuria MSK: joint pain or joint stiffness Neuro: TIA or stroke symptoms Derm: pruritus and skin lesion changes Psych: anxiety and depression  Objective   PE Blood pressure (!) 157/81, pulse 79, temperature 98.3 F (36.8 C), temperature source Oral, resp. rate 18, weight (!) 178.4 kg, SpO2 94 %. Constitutional: NAD; conversant; no deformities Eyes: Moist conjunctiva; no lid lag; anicteric; PERRL Neck: Trachea midline; no thyromegaly Lungs: Normal respiratory effort; no tactile fremitus CV: RRR; no palpable thrills; no pitting edema GI: Abd Soft, nontender; no palpable hepatosplenomegaly MSK: Normal range of motion of extremities; no clubbing/cyanosis Psychiatric: Appropriate affect; alert and oriented x3 Lymphatic: No palpable cervical or axillary lymphadenopathy  No results found for this or any previous visit (from the past 24 hour(s)).  Imaging Orders  No imaging studies ordered today     Assessment and Plan   Mr. Plack is a 60 year old male who presented for bariatric surgery consultation. He has morbid obesity (BMI 50), hyperlipidemia, and sleep apnea.  I reviewed the robotic gastric sleeve and robotic gastric bypass. I reviewed the risks, benefits, and alternatives to each procedure. I entered his basic  demographic information into the [facility] bariatric surgery risk/benefit calculator in order to facilitate this discussion. After full discussion all questions answered patient's interest in pursuing a robotic sleeve gastrectomy. We will enter him in the pathway and begin with the following workup:   -  Bloodwork - high cholesterol and HgA1C of 5.7 noted - Chest x-ray 05/30/20 with no active cardiopulmonary disease - EKG 06/10/20 with normal sinus rhythm - Cardiac consultation as he has had a stent in the past - He is compliant with his CPAP and does not require a sleep study - Psychology evaluation - working with [place] consult completed with [employer]  - Upper endoscopy - some duodenitis, negative for h. pylori   Today he presents for elective robotic sleeve gastrectomy.  The procedure itself as well as its risks, benefits and alternatives have been discussed on multiple occasions.  He has granted consent to proceed.  We will proceed as scheduled.  Felicie Morn, MD  [facility] Use [email or web address] to contact on call provider

## 2020-10-15 NOTE — Anesthesia Postprocedure Evaluation (Signed)
Anesthesia Post Note  Patient: Nathan Pope  Procedure(s) Performed: XI ROBOTIC GASTRIC SLEEVE RESECTION (Abdomen) UPPER GI ENDOSCOPY     Patient location during evaluation: PACU Anesthesia Type: General Level of consciousness: awake and alert and oriented Pain management: pain level controlled Vital Signs Assessment: post-procedure vital signs reviewed and stable Respiratory status: spontaneous breathing, nonlabored ventilation and respiratory function stable Cardiovascular status: blood pressure returned to baseline and stable Postop Assessment: no apparent nausea or vomiting Anesthetic complications: no   No notable events documented.  Last Vitals:  Vitals:   10/15/20 0945 10/15/20 1000  BP: 137/77 135/73  Pulse: 83 81  Resp: 13 16  Temp:  36.8 C  SpO2: 94% 93%    Last Pain:  Vitals:   10/15/20 1015  TempSrc:   PainSc: Asleep                 Amayah Staheli A.

## 2020-10-15 NOTE — Op Note (Signed)
Patient: Nathan Pope (02/28/1960, [id number])  Date of Surgery: 10/15/2020   Preoperative Diagnosis: Morbid obesity, BMI 50  Postoperative Diagnosis: Morbid obesity, BMI 50   Surgical Procedure: XI ROBOTIC GASTRIC SLEEVE RESECTION: 67209 (CPT) UPPER GI ENDOSCOPY: [id number]   Operative Team Members:  Surgeon(s) and Role:    * Natavia Sublette, Nickola Major, MD - Primary    * Clovis Riley, MD - Assisting   Anesthesiologist: Josephine Igo, MD CRNA: Gerald Leitz, CRNA   Anesthesia: General   Fluids:  No intake/output data recorded.  Complications: none  Drains:  none   Specimen: Sleeve gastrectomy  Disposition:  PACU - hemodynamically stable.  Plan of Care: Admit to inpatient     Indications for Procedure:  Mr. Gopaul is a 60 year old male who presented for bariatric surgery consultation. He has morbid obesity (BMI 50), hyperlipidemia, and sleep apnea.  I reviewed the robotic gastric sleeve and robotic gastric bypass. I reviewed the risks, benefits, and alternatives to each procedure. I entered his basic demographic information into the [facility] bariatric surgery risk/benefit calculator in order to facilitate this discussion. After full discussion all questions answered patient's interest in pursuing a robotic sleeve gastrectomy. We will enter him in the pathway and begin with the following workup:   - Bloodwork - high cholesterol and HgA1C of 5.7 noted - Chest x-ray 05/30/20 with no active cardiopulmonary disease - EKG 06/10/20 with normal sinus rhythm - Cardiac consultation as he has had a stent in the past - He is compliant with his CPAP and does not require a sleep study - Psychology evaluation - working with [place] consult completed with [employer]  - Upper endoscopy - some duodenitis, negative for h. pylori     Today he presents for elective robotic sleeve gastrectomy.  The procedure itself as well as its risks, benefits and alternatives  have been discussed on multiple occasions.  He has granted consent to proceed.  We will proceed as scheduled.   Findings: Normal anatomy  Infection status: Patient: Private Patient Elective Case Case: Elective Infection Present At Time Of Surgery (PATOS): none   Description of Procedure:   On the date stated above, the patient was taken to the operating room suite and placed in supine positioning.  General endotracheal anesthesia was induced.  A timeout was completed verifying the correct patient, procedure, positioning and equipment needed for the case.  The patient's abdomen was prepped and draped in the usual sterile fashion.  I entered the patient's right upper quadrant using a 5 mm trocar in the optical technique.  There was no trauma to underlying viscera with initial trocar placement.  The abdomen was insufflated 15 mmHg.  A total of 4 robotic trochars were placed across the mid abdomen, including the 5 mm initial trocar being upsized to a 8 mm trocar.  The robotic stapler trocar was placed in the number two position.  The [facility] liver retractor was placed through the subxiphoid region and under the left lobe of the liver and was connected to the rail of the bed.  A 12 mm port was tunneled through the LLQ for the assistant.  A TAP block was placed using marcaine and Exparel under direct vision of the laparoscope.  The [place] was docked and we transitioned to robotic surgery.   Using the tip up fenestrated grasper, fenestrated bipolar, 30 degree camera and Synchroseal from the patient's right to left, we began by dissecting the  angle of His off the left crus of the diaphragm.  The adhesions between the stomach, spleen and diaphragm were divided using the Synchroseal to define the angle of His.  I then started 6 cm away from the pylorus along the greater curve the stomach and divided the gastroepiploic vessels and the gastrocolic ligament.  The lesser sac was entered.   There were really no adhesions to the posterior wall of the stomach.  The greater curve was mobilized working superiorly toward the spleen.  All of the gastroepiploic and short gastric vessels were divided as we divided the gastrocolic and gastrosplenic ligaments.  As we reached the splenic hilum, I lifted the stomach anteriorly.  I created a tunnel between the stomach and its attachments to the retroperitoneum posteriorly just to the left of the GE junction until I encountered the left crus and my previous angle of His dissection.  We then were able to approach the shortest of the short gastrics both from the greater curve the stomach laterally and from the left crus medially.  These were divided using the Synchroseal and the fundus of the stomach was fully mobilized.  With the stomach fully mobilized we direct our attention to stapling.  A 40 French VISI G was inserted into the stomach and positioned along the lesser curve the stomach and suction was applied.  The 60 mm robotic sureform linear stapler was used to create the sleeve gastrectomy.  We started 6 cm from the pylorus and were careful to avoid narrowing at the incisura.  We stayed about 1 cm away from the GE junction to protect the sling fibers.  We used 1 green load, then 1 blue loads, and then 3 white loads of the 60 mm sureform linear stapler.  With the sleeve gastrectomy completed the VISI G was taken off suction and removed.  A 2-0 vicryl was run to perform an omentopexy, connecting the staple line to the divided omentum and gastrocolic ligament. The stitch was locked at any points of oozing along the staple line.  Then, we performed an upper endoscopy.  The foregut was submerged in saline irrigation and the adult upper endoscope was inserted into the stomach as far as the pylorus to inspect the sleeve.  The sleeve appeared appropriately oriented without any twisting.  There was good hemostasis.  The sleeve was widely patent at the incisura with no  narrowing.  There was no significant retained fundus.  The sleeve was inflated with the endoscope and there was no bubbling of the irrigation, suggesting a negative leak test and a airtight sleeve gastrectomy.  The foregut was decompressed with the endoscope and the endoscope was removed. There was good hemostasis at the end of the case.  The robot was undocked and moved away from the field.  The sleeve gastrectomy specimen was removed from the stapler port.  The fascia of the stapler port was closed using a 0 Vicryl on a PMI suture passer.   The liver retractor was removed under direct vision.  The pneumoperitoneum was evacuated.  The skin was closed using 4-0 Monocryl and Dermabond.  All sponge and needle counts were correct at the end of the case.    Louanna Raw, MD General, Bariatric, & Minimally Invasive Surgery [facility], [place]

## 2020-10-15 NOTE — Progress Notes (Signed)
PHARMACY CONSULT FOR:  Risk Assessment for Post-Discharge VTE Following Bariatric Surgery  Post-Discharge VTE Risk Assessment: This patient's probability of 30-day post-discharge VTE is increased due to the factors marked:  X Male  X  Age >/=60 years  X  BMI >/=50 kg/m2    CHF    Dyspnea at Rest    Paraplegia  X  Non-gastric-band surgery    Operation Time >/=3 hr    Return to OR     Length of Stay >/= 3 d   Hx of VTE   Hypercoagulable condition   Significant venous stasis   Predicted probability of 30-day post-discharge VTE: 0.99%  Other patient-specific factors to consider: Risk slightly below 1  Recommendation for Discharge: Enoxaparin 60 mg [place] q12h x 4 weeks post-discharge - for risk > 1% Per Dr Lanny Hurst, per secure chat  Nathan Pope is a 60 y.o. male who underwent Gastric Sleeve Resection on 10/11   Case start: 0748 Case end: 0906  No Known Allergies  Patient Measurements: Height: 6\' 2"  (188 cm) Weight: (!) 178.5 kg (393 lb 8.3 oz) IBW/kg (Calculated) : 82.2 Body mass index is 50.53 kg/m.  Recent Labs    10/15/20 0700  WBC 6.7  HGB 15.1  HCT 44.8  PLT 219  CREATININE 0.79  ALBUMIN 4.3  PROT 7.3  AST 26  ALT 34  ALKPHOS 56  BILITOT 1.1    Estimated Creatinine Clearance: 165.5 mL/min (by C-G formula based on SCr of 0.79 mg/dL).  Past Medical History:  Diagnosis Date   Anxiety    Arthritis    Cancer ([place])    Coronary artery disease    Hypercholesterolemia    Hypertension    Obesity    Osteoarthritis    Sleep apnea      Medications Prior to Admission  Medication Sig Dispense Refill Last Dose   acetaminophen (TYLENOL) 500 MG tablet Take 1,000 mg by mouth every 6 (six) hours as needed for headache.   Past Month   aspirin EC 81 MG tablet Take 81 mg by mouth daily.   10/14/2020   atorvastatin (LIPITOR) 80 MG tablet Take 80 mg by mouth daily.  5 10/14/2020   CARTIA XT 240 MG 24 hr capsule Take 240 mg by mouth daily.  5 10/15/2020    doxycycline (MONODOX) 50 MG capsule Take 50 mg by mouth daily as needed (rosacea).   10/14/2020   ezetimibe (ZETIA) 10 MG tablet Take 1 tablet (10 mg total) by mouth daily. 90 tablet 2 10/14/2020   lisinopril-hydrochlorothiazide (ZESTORETIC) 20-12.5 MG tablet Take 1 tablet by mouth daily.   10/14/2020   pantoprazole (PROTONIX) 40 MG tablet Take 1 tablet (40 mg total) by mouth daily. 90 tablet 3 10/15/2020    Minda Ditto PharmD [facility] [phone] 10/15/2020, 1:28 PM

## 2020-10-15 NOTE — Anesthesia Procedure Notes (Signed)
Procedure Name: Intubation Date/Time: 10/15/2020 8:10 AM Performed by: Gerald Leitz, CRNA Pre-anesthesia Checklist: Patient identified, Patient being monitored, Timeout performed, Emergency Drugs available and Suction available Patient Re-evaluated:Patient Re-evaluated prior to induction Oxygen Delivery Method: Circle system utilized Preoxygenation: Pre-oxygenation with 100% oxygen Induction Type: IV induction Ventilation: Mask ventilation without difficulty Laryngoscope Size: Mac and 3 Grade View: Grade I Tube type: Oral Tube size: 8.0 mm Number of attempts: 1 Airway Equipment and Method: Stylet Placement Confirmation: ETT inserted through vocal cords under direct vision, positive ETCO2 and breath sounds checked- equal and bilateral Secured at: 24 cm Tube secured with: Tape Dental Injury: Teeth and Oropharynx as per pre-operative assessment

## 2020-10-15 NOTE — Transfer of Care (Signed)
Immediate Anesthesia Transfer of Care Note  Patient: Nathan Pope  Procedure(s) Performed: Procedure(s): XI ROBOTIC GASTRIC SLEEVE RESECTION (N/A) UPPER GI ENDOSCOPY (N/A)  Patient Location: PACU  Anesthesia Type:General  Level of Consciousness: Alert, Awake, Oriented  Airway & Oxygen Therapy: Patient Spontanous Breathing  Post-op Assessment: Report given to RN  Post vital signs: Reviewed and stable  Last Vitals:  Vitals:   10/15/20 0619  BP: (!) 157/81  Pulse: 79  Resp: 18  Temp: 36.8 C  SpO2: 45%    Complications: No apparent anesthesia complications

## 2020-10-16 ENCOUNTER — Encounter (HOSPITAL_COMMUNITY): Payer: Self-pay | Admitting: Surgery

## 2020-10-16 LAB — CBC WITH DIFFERENTIAL/PLATELET
Abs Immature Granulocytes: 0.05 10*3/uL (ref 0.00–0.07)
Basophils Absolute: 0 10*3/uL (ref 0.0–0.1)
Basophils Relative: 0 %
Eosinophils Absolute: 0 10*3/uL (ref 0.0–0.5)
Eosinophils Relative: 0 %
HCT: 39.4 % (ref 39.0–52.0)
Hemoglobin: 13.7 g/dL (ref 13.0–17.0)
Immature Granulocytes: 0 %
Lymphocytes Relative: 8 %
Lymphs Abs: 0.9 10*3/uL (ref 0.7–4.0)
MCH: 32.1 pg (ref 26.0–34.0)
MCHC: 34.8 g/dL (ref 30.0–36.0)
MCV: 92.3 fL (ref 80.0–100.0)
Monocytes Absolute: 0.5 10*3/uL (ref 0.1–1.0)
Monocytes Relative: 4 %
Neutro Abs: 10.4 10*3/uL — ABNORMAL HIGH (ref 1.7–7.7)
Neutrophils Relative %: 88 %
Platelets: DECREASED 10*3/uL (ref 150–400)
RBC: 4.27 MIL/uL (ref 4.22–5.81)
RDW: 12.5 % (ref 11.5–15.5)
WBC: 12 10*3/uL — ABNORMAL HIGH (ref 4.0–10.5)
nRBC: 0 % (ref 0.0–0.2)

## 2020-10-16 NOTE — Discharge Instructions (Signed)

## 2020-10-16 NOTE — Progress Notes (Signed)
Nutrition Education Note  Received consult for diet education for patient s/p bariatric surgery. He is POD #1 sleeve gastrectomy.  Discussed 2 week post op diet with pt. Emphasized that liquids must be non carbonated, non caffeinated, and sugar free. Fluid goals discussed. Pt to follow up with outpatient bariatric RD for further diet progression after 2 weeks. Multivitamins and minerals also reviewed. Teach back method used, pt expressed understanding, expect good compliance.  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 He reports discussing caffeinated coffee with outpatient RD and was cleared to drink 1 cup/day.   If nutrition issues arise, please consult RD.     Jarome [place]atin, MS, RD, LDN, CNSC Inpatient Clinical Dietitian RD pager # available in [place]  After hours/weekend pager # available in [facility]

## 2020-10-16 NOTE — Plan of Care (Signed)
Instructions were reviewed with patient. All questions were answered. Patient was transported to main entrance by wheelchair. ° °

## 2020-10-16 NOTE — Progress Notes (Signed)
S: Slept fairly well.  Pain is well controlled.  Had a fair amount of nausea yesterday but this is mostly subsided.  Tolerating liquids and proteins without difficulty  O: Vitals, labs, intake/output, and orders reviewed at this time.  Afebrile, no tachycardia, normotensive p.o. intake 480, urine output 2875.  Hemoglobin 13.7 (15.1 preop)   Gen: A&Ox3, no distress  H&N: EOMI, atraumatic, neck supple Chest: unlabored respirations, RRR Abd: soft, nontender, nondistended, incision(s) c/d/i with Dermabond, no cellulitis or hematoma Ext: warm, no edema Neuro: grossly normal  Lines/tubes/drains: piv  A/P: Postop day 1 status post robotic sleeve gastrectomy -Continue liquids and protein shake -Continue SCDs, Lovenox, pulmonary toilet and ambulation -Discharge home today   Romana Juniper, MD [facility], [place]

## 2020-10-16 NOTE — Progress Notes (Signed)
Patient alert and oriented, pain is controlled. Patient is tolerating fluids, advanced to protein shake today, patient is tolerating well.  Reviewed Gastric sleeve discharge instructions with patient and patient is able to articulate understanding.  Provided information on BELT program, Support Group and [facility] outpatient pharmacy. Pt also received discharge VTE education. Pt has received Lovenox education kit and watched Lovenox "patient self injection video". All questions answered, will continue to monitor.

## 2020-10-16 NOTE — Progress Notes (Signed)
24hr fluid recall: 854mL. Per dehydration protocol, will call pt to f/u post op within one week.

## 2020-10-17 ENCOUNTER — Other Ambulatory Visit (HOSPITAL_COMMUNITY): Payer: Self-pay

## 2020-10-17 LAB — SURGICAL PATHOLOGY

## 2020-10-21 ENCOUNTER — Telehealth (HOSPITAL_COMMUNITY): Payer: Self-pay | Admitting: *Deleted

## 2020-10-21 NOTE — Telephone Encounter (Signed)
1.  Tell me about your pain and pain management? Pt denies any pain.  2.  Let's talk about fluid intake.  How much total fluid are you taking in? Pt states that he is getting in at minimum 64oz of fluid including protein shakes, bottled water, chicken broth, popsicles, and yogurt.  3.  How much protein have you taken in the last 2 days? Pt states he is meeting his goal of 80g of protein each day with the protein shakes and yogurt.  4.  Have you had nausea?  Tell me about when have experienced nausea and what you did to help? Pt denies nausea.   5.  Has the frequency or color changed with your urine? Pt states that he is urinating "fine" with no changes in frequency or urgency.     6.  Tell me what your incisions look like? "Incisions look fine". Pt denies a fever, chills.  Pt states incisions are not swollen, open, or draining.  Pt encouraged to call [facility] if incisions change.   7.  Have you been passing gas? BM? Pt states that he is having BMs. Last BM 10/18/20.     8.  If a problem or question were to arise who would you call?  Do you know contact numbers for [place], [facility], and [facility]? Pt denies dehydration symptoms.  Pt can describe s/sx of dehydration.  Pt knows to call [facility] for surgical, [facility] for nutrition, and [place] for non-urgent questions or concerns.   9.  How has the walking going? Pt states he is walking around and able to be active without difficulty.   10. Are you still using your incentive spirometer?  If so, how often? Pt states that he is using the I.S. every day and "getting all the way to the top". Pt encouraged to use incentive spirometer, at least 10x every hour while awake until he sees the surgeon.  11.  How are your vitamins and calcium going?  How are you taking them? Pt states that he is taking his supplements and vitamins without difficulty.  Pt states that he is taking the Lovenox injections every 12 hours without any difficulty.  Reminded patient that the first 30  days post-operatively are important for successful recovery.  Practice good hand hygiene, wearing a mask when appropriate (since optional in most places), and minimizing exposure to people who live outside of the home, especially if they are exhibiting any respiratory, GI, or illness-like symptoms.

## 2020-10-29 ENCOUNTER — Encounter: Payer: BLUE CROSS/BLUE SHIELD | Attending: Surgery | Admitting: Skilled Nursing Facility1

## 2020-10-29 ENCOUNTER — Other Ambulatory Visit: Payer: Self-pay

## 2020-10-29 DIAGNOSIS — Z6841 Body Mass Index (BMI) 40.0 and over, adult: Secondary | ICD-10-CM | POA: Diagnosis present

## 2020-10-30 NOTE — Progress Notes (Signed)
2 Week Post-Operative Nutrition Class   Patient was seen on 10/29/2020 for Post-Operative Nutrition education at the [facility].    Surgery date: 10/15/2020 Surgery type: sleeve gastrectomy  Start weight at [facility]: 420 Weight today: 376.7 pounds Bowel Habits: Every day to every other day no complaints   Body Composition Scale 10/29/2020  Current Body Weight 376.7  Total Body Fat % 41  Visceral Fat 41  Fat-Free Mass % 58.9   Total Body Water % 39.9  Muscle-Mass lbs 71.2  BMI 48.6  Body Fat Displacement          Torso  lbs 96         Left Leg  lbs 19.2         Right Leg  lbs 19.2         Left Arm  lbs 9.6         Right Arm   lbs 9.6      The following the learning objectives were met by the patient during this course: Identifies Phase 3 (Soft, High Proteins) Dietary Goals and will begin from 2 weeks post-operatively to 2 months post-operatively Identifies appropriate sources of fluids and proteins  Identifies appropriate fat sources and healthy verses unhealthy fat types   States protein recommendations and appropriate sources post-operatively Identifies the need for appropriate texture modifications, mastication, and bite sizes when consuming solids Identifies appropriate fat consumption and sources Identifies appropriate multivitamin and calcium sources post-operatively Describes the need for physical activity post-operatively and will follow MD recommendations States when to call healthcare provider regarding medication questions or post-operative complications   Handouts given during class include: Phase 3A: Soft, High Protein Diet Handout Phase 3 High Protein Meals Healthy Fats   Follow-Up Plan: Patient will follow-up at [facility] in 6 weeks for 2 month post-op nutrition visit for diet advancement per MD.

## 2020-11-04 ENCOUNTER — Telehealth: Payer: Self-pay | Admitting: Skilled Nursing Facility1

## 2020-11-04 NOTE — Telephone Encounter (Signed)
RD called pt to verify fluid intake once starting soft, solid proteins 2 week post-bariatric surgery.   Daily Fluid intake: 64 oz Daily Protein intake: 80 g Bowel Habits: taking milk of magnesia   Concerns/issues:   Pt states he needs milk of magnesia to have a bowel movement. Dietitian advised at this point that is completely okay as long as it continues to work for him and if it starts not to work then he can communicate with his Psychologist, sport and exercise.

## 2020-11-27 ENCOUNTER — Ambulatory Visit: Payer: BLUE CROSS/BLUE SHIELD | Admitting: Skilled Nursing Facility1

## 2020-12-01 NOTE — Progress Notes (Deleted)
Cardiology Office Note:    Date:  12/11/2020   ID:  Nathan Pope, DOB 1960/05/09, MRN [id number]  PCP:  Caryl Bis, MD   [facility] Providers Cardiologist:  None {  Referring MD: Caryl Bis, MD    History of Present Illness:    Nathan Pope is a 60 y.o. male with a hx of CAD with history of PCI in LAD, HTN, HLD, and obesity who presents to clinic for follow-up.  Last seen in clinic on 06/2020 for pre-op evaluation for bariatric surgery. He was active at that time and did not need further cardiac testing.  Today, ***  Past Medical History:  Diagnosis Date   Anxiety    Arthritis    Cancer ([place])    Coronary artery disease    Hypercholesterolemia    Hypertension    Obesity    Osteoarthritis    Sleep apnea     Past Surgical History:  Procedure Laterality Date   BIOPSY  08/09/2020   Procedure: BIOPSY;  Surgeon: Felicie Morn, MD;  Location: [facility];  Service: General;;   CARDIAC SURGERY  2005   stent   ESOPHAGOGASTRODUODENOSCOPY N/A 08/09/2020   Procedure: ESOPHAGOGASTRODUODENOSCOPY (EGD);  Surgeon: Felicie Morn, MD;  Location: [facility];  Service: General;  Laterality: N/A;   UPPER GI ENDOSCOPY N/A 10/15/2020   Procedure: UPPER GI ENDOSCOPY;  Surgeon: Felicie Morn, MD;  Location: [facility];  Service: General;  Laterality: N/A;    Current Medications: Current Meds  Medication Sig   acetaminophen (TYLENOL) 500 MG tablet Take 1,000 mg by mouth every 6 (six) hours as needed for headache.   aspirin EC 81 MG tablet Take 81 mg by mouth daily.   atorvastatin (LIPITOR) 80 MG tablet Take 80 mg by mouth daily.   CARTIA XT 240 MG 24 hr capsule Take 240 mg by mouth daily.   doxycycline (MONODOX) 50 MG capsule Take 50 mg by mouth daily as needed (rosacea).   ezetimibe (ZETIA) 10 MG tablet Take 1 tablet (10 mg total) by mouth daily.   lisinopril-hydrochlorothiazide (ZESTORETIC) 20-12.5 MG tablet Take 1 tablet by mouth daily.      Allergies:   Patient has no known allergies.   Social History   Socioeconomic History   Marital status: Married    Spouse name: Not on file   Number of children: Not on file   Years of education: Not on file   Highest education level: Not on file  Occupational History   Not on file  Tobacco Use   Smoking status: Never   Smokeless tobacco: Never  Vaping Use   Vaping Use: Never used  Substance and Sexual Activity   Alcohol use: Not Currently    Alcohol/week: 1.0 standard drink    Types: 1 Cans of beer per week   Drug use: No   Sexual activity: Not on file  Other Topics Concern   Not on file  Social History Narrative   Not on file   Social Determinants of Health   Financial Resource Strain: Not on file  Food Insecurity: Not on file  Transportation Needs: Not on file  Physical Activity: Not on file  Stress: Not on file  Social Connections: Not on file     Family History: The patient's family history includes Arthritis in his father; Diabetes in his father; Hypercalcemia in his maternal grandfather; Hypertension in his father.  ROS:   Please see the history of present illness.  Review of Systems  Constitutional:  Negative for chills and fever.  HENT:  Negative for sore throat.   Eyes:  Negative for blurred vision.  Respiratory:  Negative for shortness of breath.   Cardiovascular:  Negative for chest pain, palpitations, orthopnea, claudication, leg swelling and PND.  Gastrointestinal:  Negative for nausea and vomiting.  Genitourinary:  Negative for hematuria.  Musculoskeletal:  Positive for joint pain and myalgias.  Neurological:  Negative for dizziness and loss of consciousness.  Psychiatric/Behavioral:  Negative for substance abuse.    EKGs/Labs/Other Studies Reviewed:    The following studies were reviewed today: CXR 06/21/20: FINDINGS: There is no evidence of acute infiltrate, pleural effusion or pneumothorax. The heart size and mediastinal contours are  within normal limits. There is moderate severity calcification of the aortic arch. Degenerative changes seen throughout the thoracic spine.   IMPRESSION: No active cardiopulmonary disease.  EKG:  EKG is  ordered today.  The ekg ordered today demonstrates NSR with HR 78  Recent Labs: 10/15/2020: ALT 34; BUN 14; Creatinine, Ser 0.79; Potassium 3.7; Sodium 135 10/16/2020: Hemoglobin 13.7; Platelets PLATELET CLUMPS NOTED ON SMEAR, COUNT APPEARS DECREASED  Recent Lipid Panel    Component Value Date/Time   CHOL 118 07/22/2020 0755   TRIG 144 07/22/2020 0755   HDL 34 (L) 07/22/2020 0755   CHOLHDL 3.5 07/22/2020 0755   LDLCALC 59 07/22/2020 0755      Physical Exam:    VS:  BP 122/74   Pulse 71   Ht 6\' 2"  (1.88 m)   Wt (!) 350 lb 3.2 oz (158.8 kg)   SpO2 93%   BMI 44.96 kg/m     Wt Readings from Last 3 Encounters:  12/11/20 (!) 350 lb 3.2 oz (158.8 kg)  10/30/20 (!) 376 lb 11.2 oz (170.9 kg)  10/15/20 (!) 393 lb 8.3 oz (178.5 kg)     GEN:  Obese, comfortable HEENT: Normal NECK: No JVD; No carotid bruits CARDIAC: RRR, no murmurs, rubs, gallops RESPIRATORY:  Clear to auscultation without rales, wheezing or rhonchi  ABDOMEN: Obese, soft, non-tender, non-distended MUSCULOSKELETAL:  No edema; No deformity  SKIN: Warm and dry NEUROLOGIC:  Alert and oriented x 3 PSYCHIATRIC:  Normal affect   ASSESSMENT:    No diagnosis found.  PLAN:    In order of problems listed above:  #CAD s/p LAD PCI in 2005: Doing well with no anginal or HF symptoms. Compliant with all medications. LVEF 60-65% on TTE 07/09/20. -Continue ASA 81mg  daily -Continue lipitor 80mg  daily -Continue lisinopril 20mg  daily -Continue zetia 10mg  daily  #HTN: Well controlled and at goal <120/80s. -Continue dilt 240mg  daily -Continue lisinopril-HCTZ 20-12.5mg  daily  #HLD: LDL 59 07/2020.  -Continue lipitor 80mg  daily -Continue zetia 10mg  daily -Repeat cholesterol in 6 months  #Ascending aortic  dilation: Measures 46mm. -Check CTA  #Morbid Obesity with BMI 53: S/p bariatric surgery in 10/2020. Continue diet and exercise. Patient very motivated to lose weight.  Medication Adjustments/Labs and Tests Ordered: Current medicines are reviewed at length with the patient today.  Concerns regarding medicines are outlined above.  No orders of the defined types were placed in this encounter.  No orders of the defined types were placed in this encounter.   There are no Patient Instructions on file for this visit.    Signed, Freada Bergeron, MD  12/11/2020 9:12 AM    [place]

## 2020-12-11 ENCOUNTER — Ambulatory Visit (INDEPENDENT_AMBULATORY_CARE_PROVIDER_SITE_OTHER): Payer: BLUE CROSS/BLUE SHIELD | Admitting: Cardiology

## 2020-12-11 ENCOUNTER — Encounter: Payer: Self-pay | Admitting: Cardiology

## 2020-12-11 ENCOUNTER — Other Ambulatory Visit: Payer: Self-pay

## 2020-12-11 VITALS — BP 122/74 | HR 71 | Ht 74.0 in | Wt 350.2 lb

## 2020-12-11 DIAGNOSIS — I77819 Aortic ectasia, unspecified site: Secondary | ICD-10-CM

## 2020-12-11 DIAGNOSIS — I25119 Atherosclerotic heart disease of native coronary artery with unspecified angina pectoris: Secondary | ICD-10-CM | POA: Diagnosis not present

## 2020-12-11 DIAGNOSIS — Z79899 Other long term (current) drug therapy: Secondary | ICD-10-CM

## 2020-12-11 DIAGNOSIS — E785 Hyperlipidemia, unspecified: Secondary | ICD-10-CM

## 2020-12-11 LAB — LIPID PANEL
Chol/HDL Ratio: 2.7 ratio (ref 0.0–5.0)
Cholesterol, Total: 106 mg/dL (ref 100–199)
HDL: 39 mg/dL — ABNORMAL LOW (ref >39)
LDL Chol Calc (NIH): 48 mg/dL (ref 0–99)
Triglycerides: 103 mg/dL (ref 0–149)
VLDL Cholesterol Cal: 19 mg/dL (ref 5–40)

## 2020-12-11 LAB — BASIC METABOLIC PANEL
BUN/Creatinine Ratio: 15 (ref 10–24)
BUN: 13 mg/dL (ref 8–27)
CO2: 23 mmol/L (ref 20–29)
Calcium: 9.6 mg/dL (ref 8.6–10.2)
Chloride: 100 mmol/L (ref 96–106)
Creatinine, Ser: 0.85 mg/dL (ref 0.76–1.27)
Glucose: 93 mg/dL (ref 70–99)
Potassium: 4.1 mmol/L (ref 3.5–5.2)
Sodium: 138 mmol/L (ref 134–144)
eGFR: 99 mL/min/{1.73_m2} (ref >59)

## 2020-12-11 NOTE — Patient Instructions (Signed)
Medication Instructions:   Your physician recommends that you continue on your current medications as directed. Please refer to the Current Medication list given to you today.  *If you need a refill on your cardiac medications before your next appointment, please call your pharmacy*   Lab Work:  IN 6 MONTHS--SAME DAY AS YOUR FOLLOW-UP VISIT WITH DR. Sherril Croon CHECK LIPIDS AT THAT TIME--PLEASE COME FASTING TO THIS LAB APPOINTMENT  If you have labs (blood work) drawn today and your tests are completely normal, you will receive your results only by: [place] (if you have MyChart) OR A paper copy in the mail If you have any lab test that is abnormal or we need to change your treatment, we will call you to review the results.   Testing/Procedures:  CT ANGIO CHEST AORTA W/WO CONTRAST TO BE DONE HERE IN THE OFFICE THIS MONTH ONLY PER DR. Johney Frame   Follow-Up: At [facility], you and your health needs are our priority.  As part of our continuing mission to provide you with exceptional heart care, we have created designated Provider Care Teams.  These Care Teams include your primary Cardiologist (physician) and Advanced Practice Providers (APPs -  Physician Assistants and Nurse Practitioners) who all work together to provide you with the care you need, when you need it.  We recommend signing up for the patient portal called "MyChart".  Sign up information is provided on this After Visit Summary.  MyChart is used to connect with patients for Virtual Visits (Telemedicine).  Patients are able to view lab/test results, encounter notes, upcoming appointments, etc.  Non-urgent messages can be sent to your provider as well.   To learn more about what you can do with MyChart, go to [email or web address].    Your next appointment:   6 month(s)  The format for your next appointment:   In Person  Provider:   DR. Isaiah Blakes HAVE YOUR LAB APPOINTMENT TO [place]

## 2020-12-11 NOTE — Progress Notes (Signed)
Cardiology Office Note:    Date:  12/11/2020   ID:  Nathan Pope, DOB 09-02-1960, MRN [id number]  PCP:  Caryl Bis, MD   [facility] Providers Cardiologist:  None {  Referring MD: Caryl Bis, MD    History of Present Illness:    Nathan Pope is a 60 y.o. male with a hx of CAD with history of PCI in LAD, HTN, HLD, and obesity who presents to clinic for follow-up.  Last seen in clinic on 06/2020 for pre-op evaluation for bariatric surgery. He was active at that time and did not need further cardiac testing.  Today, the patient is doing very well. Lost >60lbs since 09/2020 both before and after his bariatric surgery. He has been compliant with diet and exercise and states he is very motivated to continue to lose weight. No chest pain, SOB, LE edema, orthopnea or PND. Continues to have right knee pain but overall this is improved with weight loss.    Past Medical History:  Diagnosis Date   Anxiety    Arthritis    Cancer ([place])    Coronary artery disease    Hypercholesterolemia    Hypertension    Obesity    Osteoarthritis    Sleep apnea     Past Surgical History:  Procedure Laterality Date   BIOPSY  08/09/2020   Procedure: BIOPSY;  Surgeon: Felicie Morn, MD;  [facility]: [facility];  Service: General;;   CARDIAC SURGERY  2005   stent   ESOPHAGOGASTRODUODENOSCOPY N/A 08/09/2020   Procedure: ESOPHAGOGASTRODUODENOSCOPY (EGD);  Surgeon: Felicie Morn, MD;  Location: [facility];  Service: General;  Laterality: N/A;   UPPER GI ENDOSCOPY N/A 10/15/2020   Procedure: UPPER GI ENDOSCOPY;  Surgeon: Felicie Morn, MD;  Location: [facility];  Service: General;  Laterality: N/A;    Current Medications: Current Meds  Medication Sig   acetaminophen (TYLENOL) 500 MG tablet Take 1,000 mg by mouth every 6 (six) hours as needed for headache.   aspirin EC 81 MG tablet Take 81 mg by mouth daily.   atorvastatin (LIPITOR) 80 MG tablet Take 80 mg by mouth  daily.   CARTIA XT 240 MG 24 hr capsule Take 240 mg by mouth daily.   doxycycline (MONODOX) 50 MG capsule Take 50 mg by mouth daily as needed (rosacea).   ezetimibe (ZETIA) 10 MG tablet Take 1 tablet (10 mg total) by mouth daily.   lisinopril-hydrochlorothiazide (ZESTORETIC) 20-12.5 MG tablet Take 1 tablet by mouth daily.     Allergies:   Patient has no known allergies.   Social History   Socioeconomic History   Marital status: Married    Spouse name: Not on file   Number of children: Not on file   Years of education: Not on file   Highest education level: Not on file  Occupational History   Not on file  Tobacco Use   Smoking status: Never   Smokeless tobacco: Never  Vaping Use   Vaping Use: Never used  Substance and Sexual Activity   Alcohol use: Not Currently    Alcohol/week: 1.0 standard drink    Types: 1 Cans of beer per week   Drug use: No   Sexual activity: Not on file  Other Topics Concern   Not on file  Social History Narrative   Not on file   Social Determinants of Health   Financial Resource Strain: Not on file  Food Insecurity: Not on file  Transportation Needs: Not  on file  Physical Activity: Not on file  Stress: Not on file  Social Connections: Not on file     Family History: The patient's family history includes Arthritis in his father; Diabetes in his father; Hypercalcemia in his maternal grandfather; Hypertension in his father.  ROS:   Please see the history of present illness.    Review of Systems  Constitutional:  Positive for weight loss. Negative for chills and fever.  HENT:  Negative for sore throat.   Eyes:  Negative for blurred vision.  Respiratory:  Negative for shortness of breath.   Cardiovascular:  Negative for chest pain, palpitations, orthopnea, claudication, leg swelling and PND.  Gastrointestinal:  Negative for nausea and vomiting.  Genitourinary:  Negative for hematuria.  Musculoskeletal:  Positive for joint pain.   Neurological:  Negative for dizziness and loss of consciousness.   EKGs/Labs/Other Studies Reviewed:    The following studies were reviewed today: CXR 2020-06-04: FINDINGS: There is no evidence of acute infiltrate, pleural effusion or pneumothorax. The heart size and mediastinal contours are within normal limits. There is moderate severity calcification of the aortic arch. Degenerative changes seen throughout the thoracic spine.   IMPRESSION: No active cardiopulmonary disease.  EKG:  EKG not performed today  Recent Labs: 10/15/2020: ALT 34; BUN 14; Creatinine, Ser 0.79; Potassium 3.7; Sodium 135 10/16/2020: Hemoglobin 13.7; Platelets PLATELET CLUMPS NOTED ON SMEAR, COUNT APPEARS DECREASED  Recent Lipid Panel    Component Value Date/Time   CHOL 118 07/22/2020 0755   TRIG 144 07/22/2020 0755   HDL 34 (L) 07/22/2020 0755   CHOLHDL 3.5 07/22/2020 0755   LDLCALC 59 07/22/2020 0755      Physical Exam:    VS:  BP 122/74   Pulse 71   Ht 6\' 2"  (1.88 m)   Wt (!) 350 lb 3.2 oz (158.8 kg)   SpO2 93%   BMI 44.96 kg/m     Wt Readings from Last 3 Encounters:  12/11/20 (!) 350 lb 3.2 oz (158.8 kg)  10/30/20 (!) 376 lb 11.2 oz (170.9 kg)  10/15/20 (!) 393 lb 8.3 oz (178.5 kg)     GEN:  Comfortable, well appearing HEENT: Normal NECK: No JVD; No carotid bruits CARDIAC: RRR, no murmurs, rubs, gallops RESPIRATORY:  CTAB ABDOMEN: Obese, soft, non-tender, non-distended MUSCULOSKELETAL:  No edema; No deformity  SKIN: Warm and dry NEUROLOGIC:  Alert and oriented x 3 PSYCHIATRIC:  Normal affect   ASSESSMENT:    1. Coronary artery disease involving native heart with angina pectoris, unspecified vessel or lesion type ([place])   2. Medication management   3. Hyperlipidemia, unspecified hyperlipidemia type   4. Aortic dilatation ([facility])    PLAN:    In order of problems listed above:  #CAD s/p LAD PCI in 2005: Doing well with no anginal or HF symptoms. Compliant with all  medications. LVEF 60-65% on TTE 07/09/20. -Continue ASA 81mg  daily -Continue lipitor 80mg  daily -Continue lisinopril 20mg  daily -Continue zetia 10mg  daily  #HTN: Well controlled and at goal <120/80s. -Continue dilt 240mg  daily -Continue lisinopril-HCTZ 20-12.5mg  daily -Will need to monitor BP with weight loss as may need less medication  #HLD: LDL 59. Goal <50. -Continue lipitor 80mg  daily -Continue zetia 10mg  daily -Repeat in 65months after weight loss  #AAA: Measures 81mm. -Check CTA  #Morbid Obesity with BMI 53: S/p bariatric surgery with >60lb weight loss. Continue diet and exercise. Patient very motivated to continue to lose weight.  Medication Adjustments/Labs and Tests Ordered: Current medicines are reviewed  at length with the patient today.  Concerns regarding medicines are outlined above.  Orders Placed This Encounter  Procedures   CT ANGIO CHEST AORTA W/CM & OR WO/CM   Lipid Profile   Basic metabolic panel   No orders of the defined types were placed in this encounter.   Patient Instructions  Medication Instructions:   Your physician recommends that you continue on your current medications as directed. Please refer to the Current Medication list given to you today.  *If you need a refill on your cardiac medications before your next appointment, please call your pharmacy*   Lab Work:  IN 6 MONTHS--SAME DAY AS YOUR FOLLOW-UP VISIT WITH DR. Sherril Croon CHECK LIPIDS AT THAT TIME--PLEASE COME FASTING TO THIS LAB APPOINTMENT  If you have labs (blood work) drawn today and your tests are completely normal, you will receive your results only by: [place] (if you have MyChart) OR A paper copy in the mail If you have any lab test that is abnormal or we need to change your treatment, we will call you to review the results.   Testing/Procedures:  CT ANGIO CHEST AORTA W/WO CONTRAST TO BE DONE HERE IN THE OFFICE THIS MONTH ONLY PER DR.  Johney Frame   Follow-Up: At [facility], you and your health needs are our priority.  As part of our continuing mission to provide you with exceptional heart care, we have created designated Provider Care Teams.  These Care Teams include your primary Cardiologist (physician) and Advanced Practice Providers (APPs -  Physician Assistants and Nurse Practitioners) who all work together to provide you with the care you need, when you need it.  We recommend signing up for the patient portal called "MyChart".  Sign up information is provided on this After Visit Summary.  MyChart is used to connect with patients for Virtual Visits (Telemedicine).  Patients are able to view lab/test results, encounter notes, upcoming appointments, etc.  Non-urgent messages can be sent to your provider as well.   To learn more about what you can do with MyChart, go to [email or web address].    Your next appointment:   6 month(s)  The format for your next appointment:   In Person  Provider:   DR. Isaiah Blakes HAVE YOUR LAB APPOINTMENT TO CHECK LIPIDS SCHEDULED THIS SAME DAY    Signed,  I,Jordan Kelly,acting as a scribe for Freada Bergeron, MD.,have documented all relevant documentation on the behalf of Freada Bergeron, MD,as directed by  Freada Bergeron, MD while in the presence of Freada Bergeron, MD.  I, Freada Bergeron, MD, have reviewed all documentation for this visit. The documentation on 12/11/20 for the exam, diagnosis, procedures, and orders are all accurate and complete.  12/11/2020 10:16 AM    [place]

## 2020-12-17 ENCOUNTER — Encounter: Payer: BLUE CROSS/BLUE SHIELD | Attending: Surgery | Admitting: Dietician

## 2020-12-17 ENCOUNTER — Encounter: Payer: Self-pay | Admitting: Dietician

## 2020-12-17 ENCOUNTER — Other Ambulatory Visit: Payer: Self-pay

## 2020-12-17 DIAGNOSIS — E669 Obesity, unspecified: Secondary | ICD-10-CM | POA: Insufficient documentation

## 2020-12-17 NOTE — Patient Instructions (Signed)
Great job!! You are doing very well!  Follow your handout for the Phase 4 Food Plan.  Start with soft cooked vegetables, progress to raw as tolerated.  Try to consume 1/2 - 1 cup of non-starchy vegetables 2 times per day, 7 days a week.  If you go 2 days without a bowel movement, take some Milk of Magnesia.

## 2020-12-17 NOTE — Progress Notes (Signed)
Bariatric Nutrition Follow-Up Visit Medical Nutrition Therapy  Appt Start Time: 1025   End Time: 1100  2 Months Post-Operative Gastric Sleeve Surgery Surgery Date: 10/15/2020  Pt's Expectations of Surgery/ Goals: Weight Loss, Lower BMI to be eligible for knee surgery  Pt given star previously:   NUTRITION ASSESSMENT    Anthropometrics  Start weight at [facility]: 420 lbs (date: 06/15/2020) Today's weight: 343.3 lbs Weight change: 33.4 lbs (since previous nutrition visit)    Body Composition Scale 10/29/2020 12/17/2020  Current Body Weight 376.7 343.3  Total Body Fat % 41 38.6  Visceral Fat 41 35  Fat-Free Mass % 58.9 61.3   Total Body Water % 39.9 42.3  Muscle-Mass lbs 71.2 64.8  BMI 48.6 44.2  Body Fat Displacement           Torso  lbs 96 82.4         Left Leg  lbs 19.2 16.4         Right Leg  lbs 19.2 16.4         Left Arm  lbs 9.6 8.2         Right Arm   lbs 9.6 8.2   Clinical  Medical hx: HTN, HLD, CAD Medications: Lisinopril-HCTZ, Zetia Labs: HDL - 39   Lifestyle & Dietary Hx Pt is no longer taking Protonix. Pt states they are doing very well. Pt reports occasional difficulty getting all of their protein and fluids. Pt is eating 5 smaller meals a day. Pt reports fits of sneezing and runny nose after eating when they switched to protein foods. Pt was trying to eat 4 oz of protein at a time, realized it was too much and is now limiting their protein to 3 oz per meal. Pt reports changes in taste. Pt reports cravings for chocolate recently, pt found sugar free Nestle Quik to mix with skim milk. Pt has been journaling their food and symptoms since surgery. Pt reports needing right knee replacement surgery, pt reports dramatic decrease in knee pain since surgery/weight loss. Pt reports times when bowel movements might take 3-4 days  Estimated daily fluid intake:  64 oz Estimated daily protein intake: 60-80 g Supplements: Bariatric MV, Calcium Current average weekly physical  activity: ADLs, walking/yard work  24-Hr Dietary Recall First Meal: 2 scrambled eggs, 1 piece of bacon Snack: 1 oz peanuts  Second Meal: 3 oz Steak tips with a little A1 sauce Snack: Deli [place] and ham slices Third Meal: 3 oz Steak tips with a little A1 sauce Snack: 1 cheese stick Beverages: water, coffee, chocolate milk.  Post-Op Goals/ Signs/ Symptoms Using straws: No Drinking while eating: No Chewing/swallowing difficulties: No Changes in vision: No Changes to mood/headaches: No Hair loss/changes to skin/nails: No Difficulty focusing/concentrating: No Sweating: No Limb weakness: No Dizziness/lightheadedness: No Palpitations: No  Carbonated/caffeinated beverages: 1 cup coffee each morning N/V/D/C/Gas: Occasional mild nausea Abdominal pain: No Dumping syndrome: No    NUTRITION DIAGNOSIS  Overweight/obesity ([place]-3.3) related to past poor dietary habits and physical inactivity as evidenced by completed bariatric surgery and following dietary guidelines for continued weight loss and healthy nutrition status.     NUTRITION INTERVENTION Nutrition counseling (C-1) and education (E-2) to facilitate bariatric surgery goals, including: Diet advancement to the next phase (phase 4) now including protein and non-starchy vegetables. The importance of consuming adequate calories as well as certain nutrients daily due to the body's need for essential vitamins, minerals, and fats The importance of daily physical activity and to reach a goal  of at least 150 minutes of moderate to vigorous physical activity weekly (or as directed by their physician) due to benefits such as increased musculature and improved lab values The importance of intuitive eating specifically learning hunger-satiety cues and understanding the importance of learning a new body: The importance of mindful eating to avoid grazing behaviors   Handouts Provided Include  Phase 4 Food Plan  Learning Style & Readiness for  Change Teaching method utilized: Visual & Auditory  Demonstrated degree of understanding via: Teach Back  Readiness Level: Change in Progress Barriers to learning/adherence to lifestyle change: None  RD's Notes for Next Visit Assess adherence to pt chosen goals [place] job!! You are doing very well! Follow your handout for the Phase 4 Food Plan. Start with soft cooked vegetables, progress to raw as tolerated. Try to consume 1/2 - 1 cup of non-starchy vegetables 2 times per day, 7 days a week. If you go 2 days without a bowel movement, take some Milk of Magnesia.   MONITORING & EVALUATION Dietary intake, weekly physical activity, body weight, and tolerance of non-starchy vegetables in 4 months.  Next Steps Patient is to follow-up in 4 months for 6 month post-op follow-up.

## 2020-12-18 ENCOUNTER — Ambulatory Visit: Payer: BLUE CROSS/BLUE SHIELD | Admitting: Skilled Nursing Facility1

## 2020-12-27 ENCOUNTER — Ambulatory Visit (INDEPENDENT_AMBULATORY_CARE_PROVIDER_SITE_OTHER)
Admission: RE | Admit: 2020-12-27 | Discharge: 2020-12-27 | Disposition: A | Discharge status: Home / Self Care | Payer: BLUE CROSS/BLUE SHIELD | Source: Ambulatory Visit | Attending: Cardiology | Admitting: Cardiology

## 2020-12-27 ENCOUNTER — Other Ambulatory Visit: Payer: Self-pay

## 2020-12-27 DIAGNOSIS — I25119 Atherosclerotic heart disease of native coronary artery with unspecified angina pectoris: Secondary | ICD-10-CM | POA: Diagnosis not present

## 2020-12-27 DIAGNOSIS — I77819 Aortic ectasia, unspecified site: Secondary | ICD-10-CM

## 2020-12-27 MED ORDER — IOHEXOL 350 MG/ML SOLN
80.0000 mL | Freq: Once | INTRAVENOUS | Status: AC | PRN
  Administered 2020-12-27: 10:00:00 80 mL via INTRAVENOUS

## 2021-03-13 ENCOUNTER — Ambulatory Visit (INDEPENDENT_AMBULATORY_CARE_PROVIDER_SITE_OTHER): Payer: BLUE CROSS/BLUE SHIELD

## 2021-03-13 ENCOUNTER — Ambulatory Visit: Payer: BLUE CROSS/BLUE SHIELD | Admitting: Orthopedic Surgery

## 2021-03-13 DIAGNOSIS — M25561 Pain in right knee: Secondary | ICD-10-CM | POA: Diagnosis not present

## 2021-03-13 DIAGNOSIS — G8929 Other chronic pain: Secondary | ICD-10-CM | POA: Diagnosis not present

## 2021-03-13 DIAGNOSIS — M1711 Unilateral primary osteoarthritis, right knee: Secondary | ICD-10-CM

## 2021-03-16 ENCOUNTER — Encounter: Payer: Self-pay | Admitting: Orthopedic Surgery

## 2021-03-16 NOTE — Progress Notes (Signed)
? ?Office Visit Note ?  ?Patient: Nathan Pope           ?Date of Birth: 05-04-1960           ?MRN: [id number] ?Visit Date: 03/13/2021 ?             ?Requested by: Caryl Bis, MD ?[address] ?[place],  [place] [address] ?PCP: Caryl Bis, MD ? ?Chief Complaint  ?Patient presents with  ? Right Knee - Pain  ? ? ? ? ?HPI: ?Patient is a 61 year old gentleman who is seen for initial evaluation for osteoarthritis and right knee pain.  Patient has had an MRI scan which showed edema medial tibial plateau.  Patient states his knee pain started after a fall off a stepladder in 2021.  Patient has used oral anti-inflammatories has had steroid injections and use physical therapy. ? ?Assessment & Plan: ?Visit Diagnoses:  ?1. Chronic pain of right knee   ?2. Unilateral primary osteoarthritis, right knee   ? ? ?Plan: With failure patient's conservative care discussed treatment options including continued conservative treatment versus total knee arthroplasty.  Patient states he would like to proceed with total knee arthroplasty.  We will proceed with surgical approval.  Recommended knee-high compression stockings for the brawny edema of both legs. ? ?Follow-Up Instructions: No follow-ups on file.  ? ?Ortho Exam ? ?Patient is alert, oriented, no adenopathy, well-dressed, normal affect, normal respiratory effort. ?Patient states he is currently on long-term disability.  He states he is lost 111 pounds due to bariatric surgery.  He has varus alignment of both knees standing he uses a cane for ambulation.  Current weight patient states is 309 pounds.  There is tenderness to palpation over the medial and lateral joint line collaterals and cruciates are stable. ? ?Imaging: ?No results found. ?No images are attached to the encounter. ? ?Labs: ?No results found for: HGBA1C, ESRSEDRATE, CRP, LABURIC, REPTSTATUS, GRAMSTAIN, CULT, LABORGA ? ? ?Lab Results  ?Component Value Date  ? ALBUMIN 4.3 10/15/2020  ? ? ?No results found for: MG ?No  results found for: VD25OH ? ?No results found for: PREALBUMIN ?CBC EXTENDED Latest Ref Rng & Units 10/16/2020 10/15/2020 09/23/2020  ?WBC 4.0 - 10.5 K/uL 12.0(H) 6.7 5.5  ?RBC 4.22 - 5.81 MIL/uL 4.27 4.74 4.67  ?HGB 13.0 - 17.0 g/dL 13.7 15.1 14.8  ?HCT 39.0 - 52.0 % 39.4 44.8 44.1  ?PLT 150 - 400 K/uL PLATELET CLUMPS NOTED ON SMEAR, COUNT APPEARS DECREASED 219 242  ?NEUTROABS 1.7 - 7.7 K/uL 10.4(H) 4.4 -  ?LYMPHSABS 0.7 - 4.0 K/uL 0.9 1.6 -  ? ? ? ?There is no height or weight on file to calculate BMI. ? ?Orders:  ?Orders Placed This Encounter  ?Procedures  ? XR Knee 1-2 Views Right  ? ?No orders of the defined types were placed in this encounter. ? ? ? Procedures: ?No procedures performed ? ?Clinical Data: ?No additional findings. ? ?ROS: ? ?All other systems negative, except as noted in the HPI. ?Review of Systems ? ?Objective: ?Vital Signs: There were no vitals taken for this visit. ? ?Specialty Comments:  ?No specialty comments available. ? ?PMFS History: ?Patient Active Problem List  ? Diagnosis Date Noted  ? Morbid obesity with BMI of 50.0-59.9, adult ([place]) 10/15/2020  ? HYPERLIPIDEMIA 09/05/2008  ? HYPERTENSION 09/05/2008  ? CAD 09/05/2008  ? IRREGULAR HEART RATE 09/05/2008  ? ?Past Medical History:  ?Diagnosis Date  ? Anxiety   ? Arthritis   ? Cancer [facility])   ?  Coronary artery disease   ? Hypercholesterolemia   ? Hypertension   ? Obesity   ? Osteoarthritis   ? Sleep apnea   ?  ?Family History  ?Problem Relation Age of Onset  ? Diabetes Father   ? Arthritis Father   ? Hypertension Father   ? Hypercalcemia Maternal Grandfather   ?  ?Past Surgical History:  ?Procedure Laterality Date  ? BIOPSY  08/09/2020  ? Procedure: BIOPSY;  Surgeon: Felicie Morn, MD;  Location: [facility] ENDOSCOPY;  Service: General;;  ? CARDIAC SURGERY  2005  ? stent  ? ESOPHAGOGASTRODUODENOSCOPY N/A 08/09/2020  ? Procedure: ESOPHAGOGASTRODUODENOSCOPY (EGD);  Surgeon: Felicie Morn, MD;  Location: [facility];  Service: General;   Laterality: N/A;  ? UPPER GI ENDOSCOPY N/A 10/15/2020  ? Procedure: UPPER GI ENDOSCOPY;  Surgeon: Felicie Morn, MD;  Location: [facility] ORS;  Service: General;  Laterality: N/A;  ? ?Social History  ? ?Occupational History  ? Not on file  ?Tobacco Use  ? Smoking status: Never  ? Smokeless tobacco: Never  ?Vaping Use  ? Vaping Use: Never used  ?Substance and Sexual Activity  ? Alcohol use: Not Currently  ?  Alcohol/week: 1.0 standard drink  ?  Types: 1 Cans of beer per week  ? Drug use: No  ? Sexual activity: Not on file  ? ? ? ? ? ?

## 2021-04-15 ENCOUNTER — Encounter: Payer: BLUE CROSS/BLUE SHIELD | Attending: Surgery | Admitting: Skilled Nursing Facility1

## 2021-04-15 DIAGNOSIS — Z6841 Body Mass Index (BMI) 40.0 and over, adult: Secondary | ICD-10-CM | POA: Diagnosis not present

## 2021-04-15 DIAGNOSIS — Z713 Dietary counseling and surveillance: Secondary | ICD-10-CM | POA: Insufficient documentation

## 2021-04-15 NOTE — Pre-Procedure Instructions (Signed)
Surgical Instructions ? ? ? Your procedure is scheduled on Friday 04/25/21. ? ? Report to [facility] Main Entrance "A" at 06:50 A.M., then check in with the Admitting office. ? Call this number if you have problems the morning of surgery: ? [phone] ? ? If you have any questions prior to your surgery date call [phone]: Open Monday-Friday 8am-4pm ? ? ? Remember: ? Do not eat after midnight the night before your surgery ? ?You may drink clear liquids until 05:50 A.M. the morning of your surgery.   ?Clear liquids allowed are: Water, Non-Citrus Juices (without pulp), Carbonated Beverages, Clear Tea, Black Coffee ONLY (NO MILK, CREAM OR POWDERED CREAMER of any kind), and Gatorade ?  ? Take these medicines the morning of surgery with A SIP OF WATER:  ? atorvastatin (LIPITOR)  ? CARTIA XT  ? ezetimibe (ZETIA) ? ? ? Take these medicines if needed:  ? acetaminophen (TYLENOL) ? doxycycline (VIBRAMYCIN) ? ? ? ?As of today, STOP taking any Aspirin (unless otherwise instructed by your surgeon) Aleve, Naproxen, Ibuprofen, Motrin, Advil, Goody's, BC's, all herbal medications, fish oil, and all vitamins. ? ?         ?Do not wear jewelry or makeup ?Do not wear lotions, powders, perfumes/colognes, or deodorant. ?Do not shave 48 hours prior to surgery.  Men may shave face and neck. ?Do not bring valuables to the hospital. ?Do not wear nail polish, gel polish, artificial nails, or any other type of covering on natural nails (fingers and toes) ?If you have artificial nails or gel coating that need to be removed by a nail salon, please have this removed prior to surgery. Artificial nails or gel coating may interfere with anesthesia's ability to adequately monitor your vital signs. ? ?[facility] is not responsible for any belongings or valuables. .  ? ?Do NOT Smoke (Tobacco/Vaping)  24 hours prior to your procedure ? ?If you use a CPAP at night, you may bring your mask for your overnight stay. ?  ?Contacts, glasses, hearing aids,  dentures or partials may not be worn into surgery, please bring cases for these belongings ?  ?For patients admitted to the hospital, discharge time will be determined by your treatment team. ?  ?Patients discharged the day of surgery will not be allowed to drive home, and someone needs to stay with them for 24 hours. ? ? ?SURGICAL WAITING ROOM VISITATION ?Patients having surgery or a procedure in a hospital may have two support people. ?Children under the age of 44 must have an adult with them who is not the patient. ?They may stay in the waiting area during the procedure and may switch out with other visitors. If the patient needs to stay at the hospital during part of their recovery, the visitor guidelines for inpatient rooms apply. ? ?Please refer to the [facility] website for the visitor guidelines for Inpatients (after your surgery is over and you are in a regular room).  ? ? ? ? ? ?Special instructions:   ? ?Oral Hygiene is also important to reduce your risk of infection.  Remember - BRUSH YOUR TEETH THE MORNING OF SURGERY WITH YOUR REGULAR TOOTHPASTE ? ? ?[facility]- Preparing For Surgery ? ?Before surgery, you can play an important role. Because skin is not sterile, your skin needs to be as free of germs as possible. You can reduce the number of germs on your skin by washing with CHG (chlorahexidine gluconate) Soap before surgery.  CHG is an antiseptic cleaner which  kills germs and bonds with the skin to continue killing germs even after washing.   ? ? ?Please do not use if you have an allergy to CHG or antibacterial soaps. If your skin becomes reddened/irritated stop using the CHG.  ?Do not shave (including legs and underarms) for at least 48 hours prior to first CHG shower. It is OK to shave your face. ? ?Please follow these instructions carefully. ?  ? ? Shower the NIGHT BEFORE SURGERY and the MORNING OF SURGERY with CHG Soap.  ? If you chose to wash your hair, wash your hair first as usual with your  normal shampoo. After you shampoo, rinse your hair and body thoroughly to remove the shampoo.  Then [employer] and genitals (private parts) with your normal soap and rinse thoroughly to remove soap. ? ?After that Use CHG Soap as you would any other liquid soap. You can apply CHG directly to the skin and wash gently with a scrungie or a clean washcloth.  ? ?Apply the CHG Soap to your body ONLY FROM THE NECK DOWN.  Do not use on open wounds or open sores. Avoid contact with your eyes, ears, mouth and genitals (private parts). Wash Face and genitals (private parts)  with your normal soap.  ? ?Wash thoroughly, paying special attention to the area where your surgery will be performed. ? ?Thoroughly rinse your body with warm water from the neck down. ? ?DO NOT shower/wash with your normal soap after using and rinsing off the CHG Soap. ? ?Pat yourself dry with a CLEAN TOWEL. ? ?Wear CLEAN PAJAMAS to bed the night before surgery ? ?Place CLEAN SHEETS on your bed the night before your surgery ? ?DO NOT SLEEP WITH PETS. ? ? ?Day of Surgery: ? ?Take a shower with CHG soap. ?Wear Clean/Comfortable clothing the morning of surgery ?Do not apply any deodorants/lotions.   ?Remember to brush your teeth WITH YOUR REGULAR TOOTHPASTE. ? ? ? ?If you received a COVID test during your pre-op visit  it is requested that you wear a mask when out in public, stay away from anyone that may not be feeling well and notify your surgeon if you develop symptoms. If you have been in contact with anyone that has tested positive in the last 10 days please notify you surgeon. ? ?  ?Please read over the following fact sheets that you were given.  ? ?

## 2021-04-15 NOTE — Progress Notes (Signed)
Bariatric Nutrition Follow-Up Visit ?Medical Nutrition Therapy  ?Appt Start Time: 1030   End Time: 1100 ? ?2 Months Post-Operative Gastric Sleeve Surgery ?Surgery Date: 10/15/2020 ? ?Pt's Expectations of Surgery/ Goals: Weight Loss, Lower BMI to be eligible for knee surgery ? ?Pt given star previously:  ? ?NUTRITION ASSESSMENT ?  ?Anthropometrics  ?Start weight at [facility]: 420 lbs (date: 06/15/2020) ?Today's weight: 306 pounds (due to patients lack of moisture in his hands the body composition scale would not read):  Patient displays no other signs of dehydration. ? ?  ?Body Composition Scale 10/29/2020 12/17/2020  ?Current Body Weight 376.7 343.3  ?Total Body Fat % 41 38.6  ?Visceral Fat 41 35  ?Fat-Free Mass % 58.9 61.3  ? Total Body Water % 39.9 42.3  ?Muscle-Mass lbs 71.2 64.8  ?BMI 48.6 44.2  ?Body Fat Displacement    ?       Torso  lbs 96 82.4  ?       Left Leg  lbs 19.2 16.4  ?       Right Leg  lbs 19.2 16.4  ?       Left Arm  lbs 9.6 8.2  ?       Right Arm   lbs 9.6 8.2  ? ?Clinical  ?Medical hx: HTN, HLD, CAD ?Medications: Lisinopril-HCTZ, Zetia ?Labs: HDL - 39 ?  ?Lifestyle & Dietary Hx ? ?Pt was disappointed he wasn't able to register on the body composition scale, and looks forward to using it in the future. ?Pt does have an upcoming knee replacement surgery scheduled 04/25/2021.  ?Pt will be going by [facility] today.  ?Pt states his bowel movements are irregular.   ?Pt states he has so much energy. ?Pt states he estimates his fluid intake at 50 oz per day. ?Pt motivated by doing what he is supposed to do. ? ? ?Estimated daily fluid intake:  50 oz ?Estimated daily protein intake: 60-80 g ?Supplements: Bariatric MV, Calcium, biotin ?Current average weekly physical activity: walking with a walking stick due to knee pain 1-2 miles ? ?24-Hr Dietary Recall ?First Meal: 2 scrambled eggs, 2 piece of bacon or sausage and eggs ?Snack: beef jerky ?Second Meal: beans + hamburger + ketchup or mustard ?Snack: [employer] [place]  and ham slices or protein shake ?Third Meal: chicken or salmon + sometimes asparagus or salad ?Snack:  ?Beverages: water + flavoring, coffee + sugar free creamer, 10 oz milk (sometimes with sugar free chocolate flavoring), low sugar lemonade ? ?Post-Op Goals/ Signs/ Symptoms ?Using straws: No ?Drinking while eating: No ?Chewing/swallowing difficulties: No ?Changes in vision: No ?Changes to mood/headaches: No ?Hair loss/changes to skin/nails: some thinning ?Difficulty focusing/concentrating: No ?Sweating: No ?Limb weakness: No ?Dizziness/lightheadedness: No ?Palpitations: No  ?Carbonated/caffeinated beverages: 1 cup coffee each morning ?N/V/D/C/Gas: No ?Abdominal pain: No ?Dumping syndrome: No ? ?  ?NUTRITION DIAGNOSIS  ?Overweight/obesity ([place]-3.3) related to past poor dietary habits and physical inactivity as evidenced by completed bariatric surgery and following dietary guidelines for continued weight loss and healthy nutrition status. ?  ?  ?NUTRITION INTERVENTION ?Nutrition counseling (C-1) and education (E-2) to facilitate bariatric surgery goals, including: ?The importance of consuming adequate calories as well as certain nutrients daily due to the body's need for essential vitamins, minerals, and fats ?The importance of daily physical activity and to reach a goal of at least 150 minutes of moderate to vigorous physical activity weekly (or as directed by their physician) due to benefits such as increased musculature and improved lab  values ?The importance of intuitive eating specifically learning hunger-satiety cues and understanding the importance of learning a new body: The importance of mindful eating to avoid grazing behaviors  ?Eat non-starchy vegetables 2 times a day 7 days a week ? ?Purpose of hydration: Water makes up over 50% of your total body water, and is part of many organs throughout the body. Water is essential to transport digested nutrients, regulate body temperature, rid the body of waste  products, and protects joints and the spinal cord. When not properly hydrated you will begin to experience headaches, cramps and dizziness. Further dehydration can result in rapid heart rate, shock, oliguria, and may cause seizures.  ?https://www.merckmanuals.com/home/hormonal-and-metabolic-disordehttps://www.usgs.gov/special-topic/water-science-school/science/water-you-water-and-human-body?qt-science_center_objects=0#qt-science_center_objectsrs/water-balance/about-body-water ?HistoricalGrowth.gl ?https://www.stevens.org/ ?PimpTShirt.fi ?https://www.health.InvestmentBrowse.at ?Why you need complex carbohydrates: Whole grains and other complex carbohydrates are required to have a healthy diet. Whole grains provide fiber which can help with blood glucose levels and help keep you satiated. Fruits and starchy vegetables provide essential vitamins and minerals required for immune function, eyesight support, brain support, bone density, wound healing and many other functions within the body. According to the current evidenced based 2020-2025 Dietary Guidelines for Americans, complex carbohydrates are part of a healthy eating pattern which is associated with a decreased risk for type 2 diabetes, cancers, and cardiovascular disease.  ? ?Goals: ?-Continue goal of 64 oz of fluid daily ?-Follow phase 5 food plan ? ? ?Handouts Provided Include  ?Phase 5 Food Plan ? ?Learning Style & Readiness for Change ?Teaching method utilized: Visual & Auditory  ?Demonstrated degree of understanding via: Teach Back  ?Readiness Level: Change in Progress ?Barriers to learning/adherence to lifestyle change: None identified ? ?RD's Notes for Next Visit ?Assess adherence to pt chosen goals ? ? ?MONITORING & EVALUATION ?Dietary intake, weekly physical  activity, body weight ? ?Next Steps ?Patient is to follow-up in July ? ?

## 2021-04-16 ENCOUNTER — Encounter (HOSPITAL_COMMUNITY): Payer: Self-pay

## 2021-04-16 ENCOUNTER — Other Ambulatory Visit: Payer: Self-pay

## 2021-04-16 ENCOUNTER — Encounter: Payer: Self-pay | Admitting: Orthopedic Surgery

## 2021-04-16 ENCOUNTER — Encounter (HOSPITAL_COMMUNITY)
Admission: RE | Admit: 2021-04-16 | Discharge: 2021-04-16 | Disposition: A | Discharge status: Home / Self Care | Payer: BLUE CROSS/BLUE SHIELD | Source: Ambulatory Visit | Attending: Orthopedic Surgery | Admitting: Orthopedic Surgery

## 2021-04-16 VITALS — BP 120/83 | HR 58 | Temp 98.5°F | Resp 18 | Ht 74.0 in | Wt 308.2 lb

## 2021-04-16 DIAGNOSIS — M1711 Unilateral primary osteoarthritis, right knee: Secondary | ICD-10-CM | POA: Insufficient documentation

## 2021-04-16 DIAGNOSIS — Z85820 Personal history of malignant melanoma of skin: Secondary | ICD-10-CM | POA: Diagnosis not present

## 2021-04-16 DIAGNOSIS — E785 Hyperlipidemia, unspecified: Secondary | ICD-10-CM | POA: Diagnosis not present

## 2021-04-16 DIAGNOSIS — Z9884 Bariatric surgery status: Secondary | ICD-10-CM | POA: Diagnosis not present

## 2021-04-16 DIAGNOSIS — I700 Atherosclerosis of aorta: Secondary | ICD-10-CM | POA: Diagnosis not present

## 2021-04-16 DIAGNOSIS — G4733 Obstructive sleep apnea (adult) (pediatric): Secondary | ICD-10-CM | POA: Insufficient documentation

## 2021-04-16 DIAGNOSIS — Z01812 Encounter for preprocedural laboratory examination: Secondary | ICD-10-CM | POA: Diagnosis present

## 2021-04-16 DIAGNOSIS — Z01818 Encounter for other preprocedural examination: Secondary | ICD-10-CM

## 2021-04-16 DIAGNOSIS — I2510 Atherosclerotic heart disease of native coronary artery without angina pectoris: Secondary | ICD-10-CM | POA: Insufficient documentation

## 2021-04-16 DIAGNOSIS — E669 Obesity, unspecified: Secondary | ICD-10-CM | POA: Diagnosis not present

## 2021-04-16 DIAGNOSIS — I119 Hypertensive heart disease without heart failure: Secondary | ICD-10-CM | POA: Diagnosis not present

## 2021-04-16 DIAGNOSIS — I10 Essential (primary) hypertension: Secondary | ICD-10-CM | POA: Insufficient documentation

## 2021-04-16 HISTORY — DX: Nausea with vomiting, unspecified: R11.2

## 2021-04-16 HISTORY — DX: Other specified postprocedural states: Z98.890

## 2021-04-16 LAB — CBC
HCT: 41.8 % (ref 39.0–52.0)
Hemoglobin: 13.8 g/dL (ref 13.0–17.0)
MCH: 31.8 pg (ref 26.0–34.0)
MCHC: 33 g/dL (ref 30.0–36.0)
MCV: 96.3 fL (ref 80.0–100.0)
Platelets: 221 10*3/uL (ref 150–400)
RBC: 4.34 MIL/uL (ref 4.22–5.81)
RDW: 13.4 % (ref 11.5–15.5)
WBC: 5.7 10*3/uL (ref 4.0–10.5)
nRBC: 0 % (ref 0.0–0.2)

## 2021-04-16 LAB — BASIC METABOLIC PANEL
Anion gap: 7 (ref 5–15)
BUN: 14 mg/dL (ref 8–23)
CO2: 28 mmol/L (ref 22–32)
Calcium: 9.2 mg/dL (ref 8.9–10.3)
Chloride: 105 mmol/L (ref 98–111)
Creatinine, Ser: 1.03 mg/dL (ref 0.61–1.24)
GFR, Estimated: >60 mL/min (ref >60)
Glucose, Bld: 100 mg/dL — ABNORMAL HIGH (ref 70–99)
Potassium: 4.5 mmol/L (ref 3.5–5.1)
Sodium: 140 mmol/L (ref 135–145)

## 2021-04-16 LAB — SURGICAL PCR SCREEN

## 2021-04-16 NOTE — Progress Notes (Signed)
PCP - Dr. Gar Ponto ?Cardiologist - Dr. Gwyndolyn Kaufman ? ?PPM/ICD - denies ? ? ?Chest x-ray - 05/30/20 ?EKG - 06/10/20 ?Stress Test - 2005 ?ECHO - 07/09/20 ?Cardiac Cath - 2005- stent placed ? ?Sleep Study - 10+ years ago, OSA+ ?CPAP - nightly ? ?DM- denies ? ?Blood Thinner Instructions: n/a ?Aspirin Instructions: pt is holding. Last dose 4/12 ? ?ERAS Protcol -yes, no drink ? ? ?COVID TEST- n/a ? ? ?Anesthesia review: no ? ?Patient denies shortness of breath, fever, cough and chest pain at PAT appointment ? ? ?All instructions explained to the patient, with a verbal understanding of the material. Patient agrees to go over the instructions while at home for a better understanding. Patient also instructed to notify surgeon of any contact with COVID+ person or if he develops any symptoms. The opportunity to ask questions was provided. ?  ?

## 2021-04-17 NOTE — Anesthesia Preprocedure Evaluation (Addendum)
Anesthesia Evaluation  ?Patient identified by MRN, date of birth, ID band ?Patient awake ? ? ? ?Reviewed: ?Allergy & Precautions, NPO status , Patient's Chart, lab work & pertinent test results ? ?History of Anesthesia Complications ?Negative for: history of anesthetic complications ? ?Airway ?Mallampati: II ? ?TM Distance: >3 FB ?Neck ROM: Full ? ? ? Dental ?  ?Pulmonary ?sleep apnea ,  ?  ?Pulmonary exam normal ? ? ? ? ? ? ? Cardiovascular ?Exercise Tolerance: Good ?hypertension, Pt. on medications ?+ CAD and + Cardiac Stents (2005)  ?Normal cardiovascular exam ? ? ?Echo 07/09/20: ?IMPRESSIONS  ??1. Left ventricular ejection fraction, by estimation, is 60 to 65%. The left ventricle has normal function. The left ventricle has no regional wall motion abnormalities. The left ventricular internal cavity size was mildly dilated. There is mild concentric left ventricular hypertrophy. Left ventricular diastolic parameters are consistent with Grade I diastolic dysfunction (impaired relaxation).  ??2. Right ventricular systolic function is normal. The right ventricular size is normal.  ??3. The mitral valve is normal in structure. No evidence of mitral valve regurgitation. No evidence of mitral stenosis.  ??4. The aortic valve is normal in structure. Aortic valve regurgitation is not visualized. No aortic stenosis is present.  ??5. Aortic dilatation noted. There is borderline dilatation of the aortic root, measuring 39 mm. There is borderline dilatation of the ascending aorta, measuring 39 mm.  ??6. The inferior vena cava is normal in size with greater than 50% respiratory variability, suggesting right atrial pressure of 3 mmHg.  ??7. Trivial pericardial effusion is present. The pericardial effusion is circumferential.  ? ?  ?Neuro/Psych ?negative neurological ROS ?   ? GI/Hepatic ?Neg liver ROS, S/p gastric sleeve ?  ?Endo/Other  ?negative endocrine ROS ? Renal/GU ?negative Renal ROS   ?negative genitourinary ?  ?Musculoskeletal ? ?(+) Arthritis ,  ? Abdominal ?  ?Peds ? Hematology ?negative hematology ROS ?(+)   ?Anesthesia Other Findings ? ? Reproductive/Obstetrics ? ?  ? ? ? ? ? ? ? ? ? ? ? ? ? ?  ?  ? ? ? ? ? ? ?Anesthesia Physical ?Anesthesia Plan ? ?ASA: 3 ? ?Anesthesia Plan: Spinal  ? ?Post-op Pain Management: Tylenol PO (pre-op)* and Regional block*  ? ?Induction:  ? ?PONV Risk Score and Plan: 1 and Propofol infusion, Treatment may vary due to age or medical condition, Ondansetron and TIVA ? ?Airway Management Planned: Nasal Cannula and Simple Face Mask ? ?Additional Equipment: None ? ?Intra-op Plan:  ? ?Post-operative Plan:  ? ?Informed Consent: I have reviewed the patients History and Physical, chart, labs and discussed the procedure including the risks, benefits and alternatives for the proposed anesthesia with the patient or authorized representative who has indicated his/her understanding and acceptance.  ? ? ? ?Dental advisory given ? ?Plan Discussed with:  ? ?Anesthesia Plan Comments: (PAT note written 04/17/2021 by Myra Gianotti, PA-C. ?)  ? ? ? ? ? ?Anesthesia Quick Evaluation ? ?

## 2021-04-17 NOTE — Progress Notes (Addendum)
Anesthesia Chart Review: ? Case: [id number] Date/Time: 04/25/21 0834  ? Procedure: RIGHT TOTAL KNEE ARTHROPLASTY (Right: Knee)  ? Anesthesia type: Choice  ? Pre-op diagnosis: Osteoarthritis Right Knee  ? Location: [facility] ROOM 05 / [facility]  ? Surgeons: Newt Minion, MD  ? ?  ? ? ?DISCUSSION: Patient is a 61 year old male scheduled for the above procedure. ? ?History includes never smoker, post-operative N/V, HTN, CAD (LAD stent 2005), hypercholesterolemia, melanoma (abdomen, s/p excision), OSA, obesity (s/p robotic gastric sleeve resection 10/15/2020), osteoarthritis.  ? ?Last cardiology visit with Dr. Johney Frame was on 12/11/20. She initially evaluated him in June 2022 for preoperative evaluation prior to bariatric surgery. He was walking 40 minutes/day without CV symptoms, so she did not recommended ischemic testing prior to that surgery. He had follow-up on 12/11/20. She notes he had lost > 60 lbs both before and after his surgery and was staying compliant with diet and exercise. No CV/HF symptoms. Echo 07/2020 showed normal LVF. Continue medical therapy. She did order a CTA chest to evaluate thoracic aortic dilation on echo-- done on 12/27/21 and ascending thoracic aorta measured 3.7 cm, no aneurysm. Six month provider follow-up planned.  He denied chest pain and SOB per PAT RN interview.  ? ?Last ASA 04/16/21. Anesthesia team to evaluate on the day of surgery. ? ? ?VS: BP 120/83   Pulse (!) 58   Temp 36.9 ?C (Oral)   Resp 18   Ht '6\' 2"'$  (1.88 m)   Wt (!) 139.8 kg   SpO2 98%   BMI 39.57 kg/m?  ? ? ?PROVIDERS: ?Caryl Bis, MD is PCP  ?Gwyndolyn Kaufman, MD is cardiologist. First established 06/10/20.  ? ? ?LABS: Labs reviewed: Acceptable for surgery.  LFTs normal 10/15/2020.  He needs a repeat nasal PCR on the day of surgery. ?(all labs ordered are listed, but only abnormal results are displayed) ? ?Labs Reviewed  ?SURGICAL PCR SCREEN - Abnormal; Notable for the following components:  ?    Result Value  ? MRSA,  PCR   (*)   ? Value: INVALID, UNABLE TO DETERMINE THE PRESENCE OF TARGET DUE TO SPECIMEN INTEGRITY. RECOLLECTION REQUESTED.  ? Staphylococcus aureus   (*)   ? Value: INVALID, UNABLE TO DETERMINE THE PRESENCE OF TARGET DUE TO SPECIMEN INTEGRITY. RECOLLECTION REQUESTED.  ? All other components within normal limits  ?BASIC METABOLIC PANEL - Abnormal; Notable for the following components:  ? Glucose, Bld 100 (*)   ? All other components within normal limits  ?CBC  ? ? ? ?IMAGES: ?CTA Chest 12/27/20: ?IMPRESSION: ?- No evidence of thoracic aortic aneurysm. Maximum diameter 3.7 cm in ?the ascending thoracic aorta. ?- Coronary artery disease. ?- Aortic Atherosclerosis (ICD10-I70.0). ? ? ?EKG: 06/10/20: NSR ? ? ?CV: ?Echo 07/09/20: ?IMPRESSIONS  ? 1. Left ventricular ejection fraction, by estimation, is 60 to 65%. The  ?left ventricle has normal function. The left ventricle has no regional  ?wall motion abnormalities. The left ventricular internal cavity size was  ?mildly dilated. There is mild  ?concentric left ventricular hypertrophy. Left ventricular diastolic  ?parameters are consistent with Grade I diastolic dysfunction (impaired  ?relaxation).  ? 2. Right ventricular systolic function is normal. The right ventricular  ?size is normal.  ? 3. The mitral valve is normal in structure. No evidence of mitral valve  ?regurgitation. No evidence of mitral stenosis.  ? 4. The aortic valve is normal in structure. Aortic valve regurgitation is  ?not visualized. No aortic stenosis is present.  ?  5. Aortic dilatation noted. There is borderline dilatation of the aortic  ?root, measuring 39 mm. There is borderline dilatation of the ascending  ?aorta, measuring 39 mm.  ? 6. The inferior vena cava is normal in size with greater than 50%  ?respiratory variability, suggesting right atrial pressure of 3 mmHg.  ? 7. Trivial pericardial effusion is present. The pericardial effusion is  ?circumferential.  ? ? ?Past Medical History:  ?Diagnosis  Date  ? Anxiety   ? Arthritis   ? Cancer [facility])   ? 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 Coronary artery disease   ? Hypercholesterolemia   ? Hypertension   ? Obesity   ? Osteoarthritis   ? PONV (postoperative nausea and vomiting)   ? Sleep apnea   ? ? ?Past Surgical History:  ?Procedure Laterality Date  ? BIOPSY  08/09/2020  ? Procedure: BIOPSY;  Surgeon: Felicie Morn, MD;  Location: [facility];  Service: General;;  ? CARDIAC SURGERY  2005  ? stent  ? ESOPHAGOGASTRODUODENOSCOPY N/A 08/09/2020  ? Procedure: ESOPHAGOGASTRODUODENOSCOPY (EGD);  Surgeon: Felicie Morn, MD;  Location: [facility];  Service: General;  Laterality: N/A;  ? UPPER GI ENDOSCOPY N/A 10/15/2020  ? Procedure: UPPER GI ENDOSCOPY;  Surgeon: Felicie Morn, MD;  Location: [facility];  Service: General;  Laterality: N/A;  ? ? ?MEDICATIONS: ? acetaminophen (TYLENOL) 500 MG tablet  ? aspirin EC 81 MG tablet  ? atorvastatin (LIPITOR) 80 MG tablet  ? BIOTIN PO  ? CARTIA XT 240 MG 24 hr capsule  ? doxycycline (VIBRAMYCIN) 50 MG capsule  ? ezetimibe (ZETIA) 10 MG tablet  ? lisinopril-hydrochlorothiazide (ZESTORETIC) 20-12.5 MG tablet  ? ?No current facility-administered medications for this encounter.  ? ? ?Myra Gianotti, PA-C ?Surgical Short Stay/Anesthesiology ?[facility] Phone [phone] ?[facility] Phone [phone] ?04/17/2021 3:58 PM ? ? ? ? ? ? ?

## 2021-04-24 MED ORDER — TRANEXAMIC ACID 1000 MG/10ML IV SOLN
2000.0000 mg | INTRAVENOUS | Status: DC
  Filled 2021-04-24: qty 20

## 2021-04-25 ENCOUNTER — Encounter (HOSPITAL_COMMUNITY)
Admission: RE | Disposition: A | Discharge status: Home / Self Care | Payer: Self-pay | Source: Home / Self Care | Attending: Orthopedic Surgery

## 2021-04-25 ENCOUNTER — Encounter (HOSPITAL_COMMUNITY): Payer: Self-pay | Admitting: Orthopedic Surgery

## 2021-04-25 ENCOUNTER — Other Ambulatory Visit: Payer: Self-pay

## 2021-04-25 ENCOUNTER — Ambulatory Visit (HOSPITAL_COMMUNITY): Payer: BLUE CROSS/BLUE SHIELD | Admitting: Vascular Surgery

## 2021-04-25 ENCOUNTER — Observation Stay (HOSPITAL_COMMUNITY)
Admission: RE | Admit: 2021-04-25 | Discharge: 2021-04-26 | Disposition: A | Discharge status: Home / Self Care | Payer: BLUE CROSS/BLUE SHIELD | Attending: Orthopedic Surgery | Admitting: Orthopedic Surgery

## 2021-04-25 ENCOUNTER — Ambulatory Visit (HOSPITAL_COMMUNITY): Payer: BLUE CROSS/BLUE SHIELD | Admitting: Anesthesiology

## 2021-04-25 DIAGNOSIS — Z85828 Personal history of other malignant neoplasm of skin: Secondary | ICD-10-CM | POA: Insufficient documentation

## 2021-04-25 DIAGNOSIS — Z01818 Encounter for other preprocedural examination: Secondary | ICD-10-CM

## 2021-04-25 DIAGNOSIS — I10 Essential (primary) hypertension: Secondary | ICD-10-CM | POA: Insufficient documentation

## 2021-04-25 DIAGNOSIS — G4733 Obstructive sleep apnea (adult) (pediatric): Secondary | ICD-10-CM | POA: Diagnosis not present

## 2021-04-25 DIAGNOSIS — Z96651 Presence of right artificial knee joint: Secondary | ICD-10-CM

## 2021-04-25 DIAGNOSIS — I2510 Atherosclerotic heart disease of native coronary artery without angina pectoris: Secondary | ICD-10-CM | POA: Diagnosis not present

## 2021-04-25 DIAGNOSIS — Z7982 Long term (current) use of aspirin: Secondary | ICD-10-CM | POA: Diagnosis not present

## 2021-04-25 DIAGNOSIS — Z79899 Other long term (current) drug therapy: Secondary | ICD-10-CM | POA: Diagnosis not present

## 2021-04-25 DIAGNOSIS — M1711 Unilateral primary osteoarthritis, right knee: Principal | ICD-10-CM

## 2021-04-25 HISTORY — PX: TOTAL KNEE ARTHROPLASTY: SHX125

## 2021-04-25 LAB — SURGICAL PCR SCREEN

## 2021-04-25 SURGERY — ARTHROPLASTY, KNEE, TOTAL
Anesthesia: Spinal | Site: Knee | Laterality: Right

## 2021-04-25 MED ORDER — MENTHOL 3 MG MT LOZG: 1.0000 | LOZENGE | OROMUCOSAL | Status: DC | PRN

## 2021-04-25 MED ORDER — ACETAMINOPHEN 325 MG PO TABS: 325.0000 mg | ORAL_TABLET | Freq: Four times a day (QID) | ORAL | Status: DC | PRN

## 2021-04-25 MED ORDER — METOCLOPRAMIDE HCL 5 MG PO TABS: 5.0000 mg | ORAL_TABLET | Freq: Three times a day (TID) | ORAL | Status: DC | PRN

## 2021-04-25 MED ORDER — LACTATED RINGERS IV SOLN: INTRAVENOUS | Status: DC

## 2021-04-25 MED ORDER — TRANEXAMIC ACID 1000 MG/10ML IV SOLN
INTRAVENOUS | Status: DC | PRN
  Administered 2021-04-25: 2000 mg via TOPICAL

## 2021-04-25 MED ORDER — EPHEDRINE SULFATE-NACL 50-0.9 MG/10ML-% IV SOSY
PREFILLED_SYRINGE | INTRAVENOUS | Status: DC | PRN
  Administered 2021-04-25: 15 mg via INTRAVENOUS
  Administered 2021-04-25: 10 mg via INTRAVENOUS

## 2021-04-25 MED ORDER — DILTIAZEM HCL ER COATED BEADS 120 MG PO CP24
240.0000 mg | ORAL_CAPSULE | Freq: Every day | ORAL | Status: DC
  Administered 2021-04-26: 240 mg via ORAL
  Filled 2021-04-25: qty 2

## 2021-04-25 MED ORDER — GLYCOPYRROLATE PF 0.2 MG/ML IJ SOSY
PREFILLED_SYRINGE | INTRAMUSCULAR | Status: AC
  Filled 2021-04-25: qty 1

## 2021-04-25 MED ORDER — FENTANYL CITRATE (PF) 100 MCG/2ML IJ SOLN
25.0000 ug | INTRAMUSCULAR | Status: DC | PRN
  Administered 2021-04-25: 25 ug via INTRAVENOUS

## 2021-04-25 MED ORDER — HYDROMORPHONE HCL 1 MG/ML IJ SOLN
0.5000 mg | INTRAMUSCULAR | Status: DC | PRN
  Administered 2021-04-25 (×2): 1 mg via INTRAVENOUS
  Filled 2021-04-25 (×2): qty 1

## 2021-04-25 MED ORDER — EPHEDRINE 5 MG/ML INJ
INTRAVENOUS | Status: AC
  Filled 2021-04-25: qty 5

## 2021-04-25 MED ORDER — FENTANYL CITRATE (PF) 250 MCG/5ML IJ SOLN
INTRAMUSCULAR | Status: DC | PRN
  Administered 2021-04-25 (×2): 50 ug via INTRAVENOUS

## 2021-04-25 MED ORDER — BUPIVACAINE IN DEXTROSE 0.75-8.25 % IT SOLN
INTRATHECAL | Status: DC | PRN
  Administered 2021-04-25: 1.6 mL via INTRATHECAL

## 2021-04-25 MED ORDER — CHLORHEXIDINE GLUCONATE 0.12 % MT SOLN
15.0000 mL | Freq: Once | OROMUCOSAL | Status: AC
  Administered 2021-04-25: 15 mL via OROMUCOSAL
  Filled 2021-04-25: qty 15

## 2021-04-25 MED ORDER — MIDAZOLAM HCL 2 MG/2ML IJ SOLN: 2.0000 mg | Freq: Once | INTRAMUSCULAR | Status: AC

## 2021-04-25 MED ORDER — MIDAZOLAM HCL 2 MG/2ML IJ SOLN
INTRAMUSCULAR | Status: AC
Start: 2021-04-25 — End: 2021-04-25
  Administered 2021-04-25: 2 mg via INTRAVENOUS
  Filled 2021-04-25: qty 2

## 2021-04-25 MED ORDER — FENTANYL CITRATE (PF) 100 MCG/2ML IJ SOLN: 100.0000 ug | Freq: Once | INTRAMUSCULAR | Status: AC

## 2021-04-25 MED ORDER — EZETIMIBE 10 MG PO TABS
10.0000 mg | ORAL_TABLET | Freq: Every day | ORAL | Status: DC
  Administered 2021-04-25 – 2021-04-26 (×2): 10 mg via ORAL
  Filled 2021-04-25 (×2): qty 1

## 2021-04-25 MED ORDER — SODIUM CHLORIDE 0.9 % IR SOLN
Status: DC | PRN
  Administered 2021-04-25: 3000 mL

## 2021-04-25 MED ORDER — GLYCOPYRROLATE PF 0.2 MG/ML IJ SOSY
PREFILLED_SYRINGE | INTRAMUSCULAR | Status: DC | PRN
  Administered 2021-04-25 (×2): .2 mg via INTRAVENOUS

## 2021-04-25 MED ORDER — HYDROCHLOROTHIAZIDE 12.5 MG PO TABS
12.5000 mg | ORAL_TABLET | Freq: Every day | ORAL | Status: DC
  Administered 2021-04-26: 12.5 mg via ORAL
  Filled 2021-04-25: qty 1

## 2021-04-25 MED ORDER — ONDANSETRON HCL 4 MG/2ML IJ SOLN: 4.0000 mg | Freq: Once | INTRAMUSCULAR | Status: DC | PRN

## 2021-04-25 MED ORDER — OXYCODONE HCL 5 MG PO TABS: 5.0000 mg | ORAL_TABLET | Freq: Once | ORAL | Status: DC | PRN

## 2021-04-25 MED ORDER — HYDROMORPHONE HCL 2 MG PO TABS: 2.0000 mg | ORAL_TABLET | ORAL | Status: DC | PRN

## 2021-04-25 MED ORDER — AMISULPRIDE (ANTIEMETIC) 5 MG/2ML IV SOLN: 10.0000 mg | Freq: Once | INTRAVENOUS | Status: DC | PRN

## 2021-04-25 MED ORDER — MIDAZOLAM HCL 2 MG/2ML IJ SOLN
INTRAMUSCULAR | Status: AC
  Filled 2021-04-25: qty 2

## 2021-04-25 MED ORDER — ATORVASTATIN CALCIUM 80 MG PO TABS
80.0000 mg | ORAL_TABLET | Freq: Every morning | ORAL | Status: DC
  Administered 2021-04-25 – 2021-04-26 (×2): 80 mg via ORAL
  Filled 2021-04-25 (×2): qty 1

## 2021-04-25 MED ORDER — LISINOPRIL-HYDROCHLOROTHIAZIDE 20-12.5 MG PO TABS: 1.0000 | ORAL_TABLET | Freq: Every day | ORAL | Status: DC

## 2021-04-25 MED ORDER — LISINOPRIL 20 MG PO TABS
20.0000 mg | ORAL_TABLET | Freq: Every day | ORAL | Status: DC
  Administered 2021-04-26: 20 mg via ORAL
  Filled 2021-04-25: qty 1

## 2021-04-25 MED ORDER — CEFAZOLIN SODIUM-DEXTROSE 2-4 GM/100ML-% IV SOLN
2.0000 g | Freq: Four times a day (QID) | INTRAVENOUS | Status: AC
  Administered 2021-04-25 (×2): 2 g via INTRAVENOUS
  Filled 2021-04-25 (×2): qty 100

## 2021-04-25 MED ORDER — POLYETHYLENE GLYCOL 3350 17 G PO PACK: 17.0000 g | PACK | Freq: Every day | ORAL | Status: DC | PRN

## 2021-04-25 MED ORDER — PHENOL 1.4 % MT LIQD: 1.0000 | OROMUCOSAL | Status: DC | PRN

## 2021-04-25 MED ORDER — ACETAMINOPHEN 500 MG PO TABS
1000.0000 mg | ORAL_TABLET | Freq: Once | ORAL | Status: AC
  Administered 2021-04-25: 1000 mg via ORAL
  Filled 2021-04-25: qty 2

## 2021-04-25 MED ORDER — ONDANSETRON HCL 4 MG/2ML IJ SOLN
4.0000 mg | Freq: Four times a day (QID) | INTRAMUSCULAR | Status: DC | PRN
  Administered 2021-04-25: 4 mg via INTRAVENOUS
  Filled 2021-04-25 (×2): qty 2

## 2021-04-25 MED ORDER — FENTANYL CITRATE (PF) 100 MCG/2ML IJ SOLN
INTRAMUSCULAR | Status: AC
  Filled 2021-04-25: qty 2

## 2021-04-25 MED ORDER — 0.9 % SODIUM CHLORIDE (POUR BTL) OPTIME
TOPICAL | Status: DC | PRN
  Administered 2021-04-25: 1000 mL

## 2021-04-25 MED ORDER — OXYCODONE HCL 5 MG PO TABS
5.0000 mg | ORAL_TABLET | ORAL | Status: DC | PRN
  Administered 2021-04-25 – 2021-04-26 (×5): 10 mg via ORAL
  Filled 2021-04-25 (×5): qty 2

## 2021-04-25 MED ORDER — ROPIVACAINE HCL 5 MG/ML IJ SOLN
INTRAMUSCULAR | Status: DC | PRN
  Administered 2021-04-25: 30 mL via EPIDURAL

## 2021-04-25 MED ORDER — METOCLOPRAMIDE HCL 5 MG/ML IJ SOLN: 5.0000 mg | Freq: Three times a day (TID) | INTRAMUSCULAR | Status: DC | PRN

## 2021-04-25 MED ORDER — ASPIRIN EC 325 MG PO TBEC
325.0000 mg | DELAYED_RELEASE_TABLET | Freq: Every day | ORAL | Status: DC
  Administered 2021-04-26: 325 mg via ORAL
  Filled 2021-04-25: qty 1

## 2021-04-25 MED ORDER — PROPOFOL 500 MG/50ML IV EMUL
INTRAVENOUS | Status: DC | PRN
  Administered 2021-04-25: 70 ug/kg/min via INTRAVENOUS

## 2021-04-25 MED ORDER — OXYCODONE HCL 5 MG/5ML PO SOLN: 5.0000 mg | Freq: Once | ORAL | Status: DC | PRN

## 2021-04-25 MED ORDER — DOCUSATE SODIUM 100 MG PO CAPS
100.0000 mg | ORAL_CAPSULE | Freq: Two times a day (BID) | ORAL | Status: DC
  Administered 2021-04-25 (×2): 100 mg via ORAL
  Filled 2021-04-25 (×3): qty 1

## 2021-04-25 MED ORDER — BISACODYL 10 MG RE SUPP: 10.0000 mg | Freq: Every day | RECTAL | Status: DC | PRN

## 2021-04-25 MED ORDER — CEFAZOLIN IN SODIUM CHLORIDE 3-0.9 GM/100ML-% IV SOLN
3.0000 g | INTRAVENOUS | Status: AC
  Administered 2021-04-25: 3 g via INTRAVENOUS
  Filled 2021-04-25: qty 100

## 2021-04-25 MED ORDER — LIDOCAINE 2% (20 MG/ML) 5 ML SYRINGE
INTRAMUSCULAR | Status: AC
  Filled 2021-04-25: qty 5

## 2021-04-25 MED ORDER — SODIUM CHLORIDE 0.9 % IV SOLN: INTRAVENOUS | Status: DC

## 2021-04-25 MED ORDER — ORAL CARE MOUTH RINSE: 15.0000 mL | Freq: Once | OROMUCOSAL | Status: AC

## 2021-04-25 MED ORDER — TRANEXAMIC ACID-NACL 1000-0.7 MG/100ML-% IV SOLN
1000.0000 mg | INTRAVENOUS | Status: AC
  Administered 2021-04-25: 1000 mg via INTRAVENOUS
  Filled 2021-04-25: qty 100

## 2021-04-25 MED ORDER — FENTANYL CITRATE (PF) 250 MCG/5ML IJ SOLN
INTRAMUSCULAR | Status: AC
  Filled 2021-04-25: qty 5

## 2021-04-25 MED ORDER — FENTANYL CITRATE (PF) 100 MCG/2ML IJ SOLN
INTRAMUSCULAR | Status: AC
  Administered 2021-04-25: 100 ug via INTRAVENOUS
  Filled 2021-04-25: qty 2

## 2021-04-25 MED ORDER — MIDAZOLAM HCL 2 MG/2ML IJ SOLN
INTRAMUSCULAR | Status: DC | PRN
  Administered 2021-04-25 (×2): 1 mg via INTRAVENOUS

## 2021-04-25 MED ORDER — PHENYLEPHRINE HCL-NACL 20-0.9 MG/250ML-% IV SOLN
INTRAVENOUS | Status: DC | PRN
  Administered 2021-04-25: 60 ug/min via INTRAVENOUS

## 2021-04-25 MED ORDER — LIDOCAINE 2% (20 MG/ML) 5 ML SYRINGE
INTRAMUSCULAR | Status: DC | PRN
Start: 2021-04-25 — End: 2021-04-25
  Administered 2021-04-25: 40 mg via INTRAVENOUS

## 2021-04-25 MED ORDER — ONDANSETRON HCL 4 MG PO TABS: 4.0000 mg | ORAL_TABLET | Freq: Four times a day (QID) | ORAL | Status: DC | PRN

## 2021-04-25 MED ORDER — ONDANSETRON HCL 4 MG/2ML IJ SOLN
INTRAMUSCULAR | Status: DC | PRN
  Administered 2021-04-25: 4 mg via INTRAVENOUS

## 2021-04-25 MED ORDER — ONDANSETRON HCL 4 MG/2ML IJ SOLN
INTRAMUSCULAR | Status: AC
  Filled 2021-04-25: qty 2

## 2021-04-25 SURGICAL SUPPLY — 60 items
BAG COUNTER SPONGE SURGICOUNT (BAG) ×3 IMPLANT
BAG DECANTER FOR FLEXI CONT (MISCELLANEOUS) ×1 IMPLANT
BAG SPNG CNTER NS LX DISP (BAG) ×1
BLADE SAGITTAL 25.0X1.19X90 (BLADE) ×3 IMPLANT
BLADE SAW SGTL 13X75X1.27 (BLADE) ×3 IMPLANT
BLADE SURG 21 STRL SS (BLADE) ×6 IMPLANT
BNDG COHESIVE 6X5 TAN ST LF (GAUZE/BANDAGES/DRESSINGS) ×2 IMPLANT
BNDG COHESIVE 6X5 TAN STRL LF (GAUZE/BANDAGES/DRESSINGS) ×5 IMPLANT
BNDG GAUZE ELAST 4 BULKY (GAUZE/BANDAGES/DRESSINGS) ×4 IMPLANT
BOWL SMART MIX CTS (DISPOSABLE) ×2 IMPLANT
BSPLAT TIB H 2 PG STRL KN RT (Joint) ×1 IMPLANT
COMP FEM TM KNEE STD 12 RT (Knees) IMPLANT
COMP PATELLAR 10X35 METAL (Joint) ×2 IMPLANT
COMP TIB TRAB METAL PERS H RT (Joint) ×2 IMPLANT
COMPONENT PATELLAR 10X35 METAL (Joint) IMPLANT
COMPONET TIB TRA METL PER H RT (Joint) IMPLANT
COOLER ICEMAN CLASSIC (MISCELLANEOUS) ×3 IMPLANT
COVER SURGICAL LIGHT HANDLE (MISCELLANEOUS) ×3 IMPLANT
CUFF TOURN SGL QUICK 34 (TOURNIQUET CUFF) ×2
CUFF TRNQT CYL 34X4.125X (TOURNIQUET CUFF) ×2 IMPLANT
DRAPE EXTREMITY T 121X128X90 (DISPOSABLE) ×3 IMPLANT
DRAPE HALF SHEET 40X57 (DRAPES) ×6 IMPLANT
DRAPE U-SHAPE 47X51 STRL (DRAPES) ×3 IMPLANT
DRSG ADAPTIC 3X8 NADH LF (GAUZE/BANDAGES/DRESSINGS) ×3 IMPLANT
DRSG PAD ABDOMINAL 8X10 ST (GAUZE/BANDAGES/DRESSINGS) ×3 IMPLANT
DURAPREP 26ML APPLICATOR (WOUND CARE) ×3 IMPLANT
ELECT REM PT RETURN 9FT ADLT (ELECTROSURGICAL) ×2
ELECTRODE REM PT RTRN 9FT ADLT (ELECTROSURGICAL) ×2 IMPLANT
FACESHIELD WRAPAROUND (MASK) ×2 IMPLANT
FACESHIELD WRAPAROUND OR TEAM (MASK) ×2 IMPLANT
GAUZE SPONGE 4X4 12PLY STRL (GAUZE/BANDAGES/DRESSINGS) ×3 IMPLANT
GLOVE BIOGEL PI IND STRL 9 (GLOVE) ×2 IMPLANT
GLOVE BIOGEL PI INDICATOR 9 (GLOVE) ×1
GLOVE SURG ORTHO 9.0 STRL STRW (GLOVE) ×5 IMPLANT
GOWN STRL REUS W/ TWL XL LVL3 (GOWN DISPOSABLE) ×4 IMPLANT
GOWN STRL REUS W/TWL XL LVL3 (GOWN DISPOSABLE) ×4
HANDPIECE INTERPULSE COAX TIP (DISPOSABLE) ×2
HDLS TROCR DRIL PIN KNEE 75 (PIN) ×2
KIT BASIN OR (CUSTOM PROCEDURE TRAY) ×3 IMPLANT
KIT TURNOVER KIT B (KITS) ×3 IMPLANT
KNEE SYSTEM FEMUR SZ 12 RT (Knees) ×2 IMPLANT
KNEE SYSTEM MC 10 RT (Knees) ×1 IMPLANT
MANIFOLD NEPTUNE II (INSTRUMENTS) ×3 IMPLANT
NS IRRIG 1000ML POUR BTL (IV SOLUTION) ×3 IMPLANT
PACK TOTAL JOINT (CUSTOM PROCEDURE TRAY) ×3 IMPLANT
PAD ARMBOARD 7.5X6 YLW CONV (MISCELLANEOUS) ×3 IMPLANT
PAD COLD SHLDR WRAP-ON (PAD) ×3 IMPLANT
PIN DRILL HDLS TROCAR 75 4PK (PIN) IMPLANT
SCREW FEMALE HEX FIX 25X2.5 (ORTHOPEDIC DISPOSABLE SUPPLIES) ×1 IMPLANT
SET HNDPC FAN SPRY TIP SCT (DISPOSABLE) ×2 IMPLANT
SPONGE T-LAP 18X18 ~~LOC~~+RFID (SPONGE) ×1 IMPLANT
STAPLER VISISTAT 35W (STAPLE) ×3 IMPLANT
SUCTION FRAZIER HANDLE 10FR (MISCELLANEOUS)
SUCTION TUBE FRAZIER 10FR DISP (MISCELLANEOUS) IMPLANT
SUT VIC AB 0 CT1 27 (SUTURE) ×4
SUT VIC AB 0 CT1 27XBRD ANBCTR (SUTURE) ×2 IMPLANT
SUT VIC AB 1 CTX 36 (SUTURE) ×4
SUT VIC AB 1 CTX36XBRD ANBCTR (SUTURE) IMPLANT
TOWEL GREEN STERILE (TOWEL DISPOSABLE) ×3 IMPLANT
TOWEL GREEN STERILE FF (TOWEL DISPOSABLE) ×3 IMPLANT

## 2021-04-25 NOTE — Anesthesia Procedure Notes (Signed)
Spinal ? ?Patient location during procedure: OR ?Reason for block: surgical anesthesia ?Staffing ?Performed: anesthesiologist  ?Anesthesiologist: Lidia Collum, MD ?Preanesthetic Checklist ?Completed: patient identified, IV checked, risks and benefits discussed, surgical consent, monitors and equipment checked, pre-op evaluation and timeout performed ?Spinal Block ?Patient position: sitting ?Prep: DuraPrep and site prepped and draped ?Patient monitoring: continuous pulse ox, blood pressure and heart rate ?Approach: midline ?Location: L3-4 ?Injection technique: single-shot ?Needle ?Needle type: Pencan  ?Needle gauge: 24 G ?Needle length: 10 cm ?Assessment ?Events: CSF return ?Additional Notes ?Functioning IV was confirmed and monitors were applied. Sterile prep and drape, including hand hygiene and sterile gloves were used. The patient was positioned and the spine was prepped. The skin was anesthetized with lidocaine.  Free flow of clear CSF was obtained prior to injecting local anesthetic into the CSF. The needle was carefully withdrawn. The patient tolerated the procedure well.  ? ? ? ?

## 2021-04-25 NOTE — Interval H&P Note (Signed)
History and Physical Interval Note: ? ?04/25/2021 ?8:54 AM ? ?Nathan Pope  has presented today for surgery, with the diagnosis of Osteoarthritis Right Knee.  The various methods of treatment have been discussed with the patient and family. After consideration of risks, benefits and other options for treatment, the patient has consented to  Procedure(s): ?RIGHT TOTAL KNEE ARTHROPLASTY (Right) as a surgical intervention.  The patient's history has been reviewed, patient examined, no change in status, stable for surgery.  I have reviewed the patient's chart and labs.  Questions were answered to the patient's satisfaction.   ? ? ?Newt Minion ? ? ?

## 2021-04-25 NOTE — Evaluation (Signed)
Physical Therapy Evaluation ?Patient Details ?Name: Nathan Pope ?MRN: [id number] ?DOB: Sep 15, 1960 ?Today's Date: 04/25/2021 ? ?History of Present Illness ? Nathan Pope is a 61 y.o. male presenting to [facility] 04/25/21 s/p R TKA. PMH includes HTN, CAD, melonoma, anxiety  ?Clinical Impression ? Pt presents to [facility] 04/25/21 s/p R TKA. Pt mod I for bed mobility and min guard A for transfers using RW. Pt very nauseous this session, however, is very self motivated. Pt educated on ankle pumps and quad sets(20/hour) to perform this afternoon/evening to increase circulation and modulate pain to pt's tolerance. PT to progress mobility as tolerated, and will continue to follow acutely.  ?  ?   ?   ? ?Recommendations for follow up therapy are one component of a multi-disciplinary discharge planning process, led by the attending physician.  Recommendations may be updated based on patient status, additional functional criteria and insurance authorization. ? ?Follow Up Recommendations Follow physician's recommendations for discharge plan and follow up therapies ? ?  ?Assistance Recommended at Discharge Intermittent Supervision/Assistance  ?Patient can return home with the following ? A little help with walking and/or transfers;Assistance with cooking/housework;Assist for transportation;Help with stairs or ramp for entrance ? ?  ?Equipment Recommendations None recommended by PT  ?Recommendations for Other Services ?    ?  ?Functional Status Assessment Patient has had a recent decline in their functional status and demonstrates the ability to make significant improvements in function in a reasonable and predictable amount of time.  ? ?  ?Precautions / Restrictions Precautions ?Precautions: Fall;Knee ?Precaution Booklet Issued: Yes (comment) ?Precaution Comments: verbally reviewed no pillow underneath knee and given handout with exercise program. ?Restrictions ?Weight Bearing Restrictions: Yes ?RLE Weight Bearing: Weight bearing as tolerated  ? ?   ? ?Mobility ? Bed Mobility ?Overal bed mobility: Modified Independent ?  ?  ?  ?  ?  ?  ?General bed mobility comments: Pt nauseous upon arrival. Pt given zofran just before session by RN. Pt mod I required increased time with use of bed rails to get to EOB. In sitting pt very nauseus and dry heaving for ~ 5 minutes, however, declined wanting to return to supine. ?  ? ?Transfers ?Overall transfer level: Needs assistance ?Equipment used: Rolling walker (2 wheels) ?Transfers: Sit to/from Stand, Bed to chair/wheelchair/BSC ?Sit to Stand: Min guard ?Stand pivot transfers: Min guard ?  ?  ?  ?  ?General transfer comment: pt required min guard A for steadying and safety with transfers. Pt did require cues for hand placement and reminded that he is WBAT through RLE. Pt required cues to safely back up to and lower down to recliner. Further mobility deferred due to high levels of nausea ?  ? ?Ambulation/Gait ?  ?  ?  ?  ?  ?  ?  ?  ? ?Stairs ?  ?  ?  ?  ?  ? ?Wheelchair Mobility ?  ? ?Modified Rankin (Stroke Patients Only) ?  ? ?  ? ?Balance Overall balance assessment: Needs assistance ?Sitting-balance support: No upper extremity supported, Feet supported ?Sitting balance-Leahy Scale: Good ?  ?  ?Standing balance support: Bilateral upper extremity supported, During functional activity, Reliant on assistive device for balance ?Standing balance-Leahy Scale: Poor ?  ?  ?  ?  ?  ?  ?  ?  ?  ?  ?  ?  ?   ? ? ? ?Pertinent Vitals/Pain Pain Assessment ?Pain Assessment: Faces ?Faces Pain Scale: Hurts little more ?Pain Location: R  knee ?Pain Descriptors / Indicators: Aching, Grimacing, Discomfort ?Pain Intervention(s): Monitored during session, Premedicated before session  ? ? ?Home Living Family/patient expects to be discharged to:: Private residence ?Living Arrangements: Spouse/significant other ?Available Help at Discharge: Available 24 hours/day;Family ?Type of Home: House ?Home Access: Stairs to enter ?Entrance Stairs-Rails:  None ?Entrance Stairs-Number of Steps: 3 ?  ?Home Layout: One level ?Home Equipment: Conservation officer, nature (2 wheels);Standard Walker;Cane - single point;Tub bench;Toilet riser ?Additional Comments: pt will be home alone on Tuesdays and Thursdays since wife babysits their grandchildren. Pt states he feels safe and comfortable being at home alone those days  ?  ?Prior Function Prior Level of Function : Independent/Modified Independent ?  ?  ?  ?  ?  ?  ?Mobility Comments: using single point cane prior to sx ?ADLs Comments: able to bathe, dress, feed himself ?  ? ? ?Hand Dominance  ?   ? ?  ?Extremity/Trunk Assessment  ? Upper Extremity Assessment ?Upper Extremity Assessment: Overall WFL for tasks assessed ?  ? ?Lower Extremity Assessment ?Lower Extremity Assessment: RLE deficits/detail ?RLE Deficits / Details: consistent with R TKA ?RLE Sensation: decreased light touch (due to nerve block) ?  ? ?Cervical / Trunk Assessment ?Cervical / Trunk Assessment: Normal  ?Communication  ? Communication: No difficulties  ?Cognition Arousal/Alertness: Awake/alert ?Behavior During Therapy: [facility] for tasks assessed/performed ?Overall Cognitive Status: Within Functional Limits for tasks assessed ?  ?  ?  ?  ?  ?  ?  ?  ?  ?  ?  ?  ?  ?  ?  ?  ?  ?  ?  ? ?  ?General Comments   ? ?  ?Exercises Total Joint Exercises ?Ankle Circles/Pumps: Both, 10 reps, Seated ?Quad Sets: 5 reps, Right, Seated  ? ?Assessment/Plan  ?  ?PT Assessment Patient needs continued PT services  ?PT Problem List Decreased strength;Decreased range of motion;Decreased activity tolerance;Decreased balance;Decreased mobility;Decreased knowledge of use of DME;Pain ? ?   ?  ?PT Treatment Interventions DME instruction;Gait training;Stair training;Functional mobility training;Therapeutic activities;Therapeutic exercise;Balance training;Neuromuscular re-education;Patient/family education   ? ?PT Goals (Current goals can be found in the Care Plan section)  ?Acute Rehab PT  Goals ?Patient Stated Goal: to return home ?PT Goal Formulation: With patient ?Time For Goal Achievement: 05/09/21 ?Potential to Achieve Goals: Good ? ?  ?Frequency 7X/week ?  ? ? ?Co-evaluation   ?  ?  ?  ?  ? ? ?  ?AM-PAC PT "6 Clicks" Mobility  ?Outcome Measure Help needed turning from your back to your side while in a flat bed without using bedrails?: None ?Help needed moving from lying on your back to sitting on the side of a flat bed without using bedrails?: None ?Help needed moving to and from a bed to a chair (including a wheelchair)?: A Little ?Help needed standing up from a chair using your arms (e.g., wheelchair or bedside chair)?: A Little ?Help needed to walk in hospital room?: A Little ?Help needed climbing 3-5 steps with a railing? : A Lot ?6 Click Score: 19 ? ?  ?End of Session Equipment Utilized During Treatment: Gait belt ?Activity Tolerance: Other (comment);Treatment limited secondary to medical complications (Comment) (nausea) ?Patient left: in chair;with chair alarm set;with call bell/phone within reach;with family/visitor present ?Nurse Communication: Mobility status;Other (comment) (broken recliner leg rest; once patient is transferred back to bed, recliner needs broken tag and new recliner brought to room) ?PT Visit Diagnosis: Other abnormalities of gait and mobility (R26.89);Unsteadiness on feet (  R26.81);Difficulty in walking, not elsewhere classified (R26.2);Pain ?Pain - Right/Left: Right ?Pain - part of body: Knee ?  ? ?Time: 7493-5521 ?PT Time Calculation (min) (ACUTE ONLY): 28 min ? ? ?Charges:   PT Evaluation ?$PT Eval Low Complexity: 1 Low ?PT Treatments ?$Therapeutic Activity: 8-22 mins ?  ?   ? ? ?Jonne Ply, SPT ? ?Jonne Ply ?04/25/2021, 4:32 PM ? ?

## 2021-04-25 NOTE — Op Note (Signed)
DATE OF SURGERY:  04/25/2021 ? ?TIME: 10:58 AM ? ?PATIENT NAME:  Nathan Pope   ? ?AGE: 61 y.o.  ? ? ?PRE-OPERATIVE DIAGNOSIS:  Osteoarthritis Right Knee ? ?POST-OPERATIVE DIAGNOSIS:  Osteoarthritis Right Knee ? ?PROCEDURE:  Procedure(s): ?RIGHT TOTAL KNEE ARTHROPLASTY ? ?SURGEON: Meridee Score ? ?ASSISTANT: Franz Dell ? ?OPERATIVE IMPLANTS: Zimmer Persona  Femur size 12, Tibia size H  2- Peg fixed bearing, Patella size 35 1-peg oval button, with a 10 mm polyethylene insert medial congruent. ? ? ? ? ?'@ENCIMAGES'$ @ ? ? ? ?PREOPERATIVE INDICATIONS: ? ? ?KHOI HAMBERGER is a 61 y.o. year old male with end stage degenerative arthritis of the knee who failed conservative treatment and elected for Total Knee Arthroplasty.  ? ?The risks, benefits, and alternatives were discussed at length including but not limited to the risks of infection, bleeding, nerve injury, stiffness, blood clots, the need for revision surgery, cardiopulmonary complications, among others, and they were willing to proceed. ? ?OPERATIVE DESCRIPTION: ? ?The patient was brought to the operative room and placed in a supine position.  Anesthesia was administered.  IV antibiotics were given.  IV TXA was initiated.  The lower extremity was prepped and draped in the usual sterile fashion.  Charlie Pitter was used to cover all exposed skin. Time out was performed.   ? ?Anterior quadriceps tendon splitting approach was performed.  The patella was everted and osteophytes were removed.  The anterior horn of the medial and lateral meniscus was removed.  ? ?The distal femur was opened with the drill and the intramedullary distal femoral cutting jig was utilized, set at 5 degrees valgus resecting 9 mm off the distal femur.  Care was taken to protect the collateral ligaments. ? ?Then the extramedullary tibial cutting jig was utilized set for 3 degree posterior slope.  Care was taken during the cut to protect the medial and collateral ligaments.  The proximal tibia was  removed along with the posterior horns of the menisci.  The PCL was sacrificed.   ? ?The extensor gap was measured and was approximately 10 mm.   ? ?The distal femoral sizing jig was applied, taking care to avoid notching.  Then the 4-in-1 cutting jig was applied and the anterior and posterior femur was cut, along with the chamfer cuts.  All posterior osteophytes were removed.  The flexion gap was then measured and was symmetric with the extension gap. ? ?The distal femoral preparation using the appropriate jig to prepare the box. ? ?The patella was then measured, and cut with the saw.   ? ?The proximal tibia sized and prepared accordingly with the reamer and the punch, and then all components were trialed with the poly insert.  The knee was found to have stable balance and full motion.  The knee was irrigated with normal saline, topical 2 g TXA was used to soak the wound. ? ?The above named components were then press fit into place.  The final polyethylene component was placed. ? ?The knee was then taken through a range of motion and the patella tracked well and the knee irrigated copiously and the parapatellar and subcutaneous tissue closed with vicryl, and skin closed with staples..  A sterile dressing was applied and patient  was taken to the PACU in stable  condition.  There were no complications. ? ?Total tourniquet time was 37 minutes. ?  ?

## 2021-04-25 NOTE — Discharge Instructions (Signed)
INSTRUCTIONS AFTER JOINT REPLACEMENT  ? ?Remove items at home which could result in a fall. This includes throw rugs or furniture in walking pathways ?ICE to the affected joint every three hours while awake for 30 minutes at a time, for at least the first 3-5 days, and then as needed for pain and swelling.  Continue to use ice for pain and swelling. You may notice swelling that will progress down to the foot and ankle.  This is normal after surgery.  Elevate your leg when you are not up walking on it.   ?Continue to use the breathing machine you got in the hospital (incentive spirometer) which will help keep your temperature down.  It is common for your temperature to cycle up and down following surgery, especially at night when you are not up moving around and exerting yourself.  The breathing machine keeps your lungs expanded and your temperature down. ? ? ?DIET:  As you were doing prior to hospitalization, we recommend a well-balanced diet. ? ?DRESSING / WOUND CARE / SHOWERING ? ?You may change your dressing 3-5 days after surgery.  Then change the dressing every day with sterile gauze.  Please use good hand washing techniques before changing the dressing.  Do not use any lotions or creams on the incision until instructed by your surgeon. ? ?ACTIVITY ? ?Increase activity slowly as tolerated, but follow the weight bearing instructions below.   ?No driving for 6 weeks or until further direction given by your physician.  You cannot drive while taking narcotics.  ?No lifting or carrying greater than 10 lbs. until further directed by your surgeon. ?Avoid periods of inactivity such as sitting longer than an hour when not asleep. This helps prevent blood clots.  ?You may return to work once you are authorized by your doctor.  ? ? ? ?WEIGHT BEARING  ? ?Weight bearing as tolerated with assist device (walker, cane, etc) as directed, use it as long as suggested by your surgeon or therapist, typically at least 4-6  weeks. ? ? ?EXERCISES ? ?Results after joint replacement surgery are often greatly improved when you follow the exercise, range of motion and muscle strengthening exercises prescribed by your doctor. Safety measures are also important to protect the joint from further injury. Any time any of these exercises cause you to have increased pain or swelling, decrease what you are doing until you are comfortable again and then slowly increase them. If you have problems or questions, call your caregiver or physical therapist for advice.  ? ?Rehabilitation is important following a joint replacement. After just a few days of immobilization, the muscles of the leg can become weakened and shrink (atrophy).  These exercises are designed to build up the tone and strength of the thigh and leg muscles and to improve motion. Often times heat used for twenty to thirty minutes before working out will loosen up your tissues and help with improving the range of motion but do not use heat for the first two weeks following surgery (sometimes heat can increase post-operative swelling).  ? ?These exercises can be done on a training (exercise) mat, on the floor, on a table or on a bed. Use whatever works the best and is most comfortable for you.    Use music or television while you are exercising so that the exercises are a pleasant break in your day. This will make your life better with the exercises acting as a break in your routine that you can look forward  to.   Perform all exercises about fifteen times, three times per day or as directed.  You should exercise both the operative leg and the other leg as well. ? ?Exercises include: ?  ?Quad Sets - Tighten up the muscle on the front of the thigh (Quad) and hold for 5-10 seconds.   ?Straight Leg Raises - With your knee straight (if you were given a brace, keep it on), lift the leg to 60 degrees, hold for 3 seconds, and slowly lower the leg.  Perform this exercise against resistance later as  your leg gets stronger.  ?Leg Slides: Lying on your back, slowly slide your foot toward your buttocks, bending your knee up off the floor (only go as far as is comfortable). Then slowly slide your foot back down until your leg is flat on the floor again.  ?Angel Wings: Lying on your back spread your legs to the side as far apart as you can without causing discomfort.  ?Hamstring Strength:  Lying on your back, push your heel against the floor with your leg straight by tightening up the muscles of your buttocks.  Repeat, but this time bend your knee to a comfortable angle, and push your heel against the floor.  You may put a pillow under the heel to make it more comfortable if necessary.  ? ?A rehabilitation program following joint replacement surgery can speed recovery and prevent re-injury in the future due to weakened muscles. Contact your doctor or a physical therapist for more information on knee rehabilitation.  ? ? ?CONSTIPATION ? ?Constipation is defined medically as fewer than three stools per week and severe constipation as less than one stool per week.  Even if you have a regular bowel pattern at home, your normal regimen is likely to be disrupted due to multiple reasons following surgery.  Combination of anesthesia, postoperative narcotics, change in appetite and fluid intake all can affect your bowels.  ? ?YOU MUST use at least one of the following options; they are listed in order of increasing strength to get the job done.  They are all available over the counter, and you may need to use some, POSSIBLY even all of these options:   ? ?Drink plenty of fluids (prune juice may be helpful) and high fiber foods ?Colace 100 mg by mouth twice a day  ?Senokot for constipation as directed and as needed Dulcolax (bisacodyl), take with full glass of water  ?Miralax (polyethylene glycol) once or twice a day as needed. ? ?If you have tried all these things and are unable to have a bowel movement in the first 3-4 days  after surgery call either your surgeon or your primary doctor.   ? ?If you experience loose stools or diarrhea, hold the medications until you stool forms back up.  If your symptoms do not get better within 1 week or if they get worse, check with your doctor.  If you experience "the worst abdominal pain ever" or develop nausea or vomiting, please contact the office immediately for further recommendations for treatment. ? ? ?ITCHING:  If you experience itching with your medications, try taking only a single pain pill, or even half a pain pill at a time.  You can also use Benadryl over the counter for itching or also to help with sleep.  ? ?TED HOSE STOCKINGS:  Use stockings on both legs until for at least 2 weeks or as directed by physician office. They may be removed at night for sleeping. ? ?  MEDICATIONS:  See your medication summary on the ?After Visit Summary? that nursing will review with you.  You may have some home medications which will be placed on hold until you complete the course of blood thinner medication.  It is important for you to complete the blood thinner medication as prescribed. ? ?PRECAUTIONS:  If you experience chest pain or shortness of breath - call 911 immediately for transfer to the hospital emergency department.  ? ?If you develop a fever greater that 101 F, purulent drainage from wound, increased redness or drainage from wound, foul odor from the wound/dressing, or calf pain - CONTACT YOUR SURGEON.   ?                                                ?FOLLOW-UP APPOINTMENTS:  If you do not already have a post-op appointment, please call the office for an appointment to be seen by your surgeon.  Guidelines for how soon to be seen are listed in your ?After Visit Summary?, but are typically between 1-4 weeks after surgery. ? ?OTHER INSTRUCTIONS:  ? ?Knee Replacement:  Do not place pillow under knee, focus on keeping the knee straight while resting. CPM instructions: 0-90 degrees, 2 hours in the  morning, 2 hours in the afternoon, and 2 hours in the evening. Place foam block, curve side up under heel at all times except when in CPM or when walking.  DO NOT modify, tear, cut, or change the foam block

## 2021-04-25 NOTE — H&P (Signed)
TOTAL KNEE ADMISSION H&P ? ?Patient is being admitted for left total knee arthroplasty. ? ?Subjective: ? ?Chief Complaint:left knee pain. ? ?HPI: Nathan Pope, 61 y.o. male, has a history of pain and functional disability in the left knee due to arthritis and has failed non-surgical conservative treatments for greater than 12 weeks to includeNSAID's and/or analgesics, corticosteriod injections, flexibility and strengthening excercises, weight reduction as appropriate, and activity modification.  Onset of symptoms was gradual, starting 8 years ago with gradually worsening course since that time. The patient noted no past surgery on the left knee(s).  Patient currently rates pain in the left knee(s) at 8 out of 10 with activity. Patient has night pain, worsening of pain with activity and weight bearing, pain that interferes with activities of daily living, pain with passive range of motion, crepitus, and joint swelling.  Patient has evidence of subchondral cysts, subchondral sclerosis, periarticular osteophytes, joint subluxation, and joint space narrowing by imaging studies. This patient has had avascular necrosis of the knee. There is no active infection. ? ?Patient Active Problem List  ? Diagnosis Date Noted  ? Morbid obesity with BMI of 50.0-59.9, adult ([place]) 10/15/2020  ? HYPERLIPIDEMIA 09/05/2008  ? HYPERTENSION 09/05/2008  ? CAD 09/05/2008  ? IRREGULAR HEART RATE 09/05/2008  ? ?Past Medical History:  ?Diagnosis Date  ? Anxiety   ? Arthritis   ? Cancer [facility])   ? 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 Coronary artery disease   ? Hypercholesterolemia   ? Hypertension   ? Obesity   ? Osteoarthritis   ? PONV (postoperative nausea and vomiting)   ? Sleep apnea   ?  ?Past Surgical History:  ?Procedure Laterality Date  ? BIOPSY  08/09/2020  ? Procedure: BIOPSY;  Surgeon: Felicie Morn, MD;  Location: [facility] ENDOSCOPY;  Service: General;;  ? CARDIAC SURGERY  2005  ? stent  ? ESOPHAGOGASTRODUODENOSCOPY N/A 08/09/2020  ?  Procedure: ESOPHAGOGASTRODUODENOSCOPY (EGD);  Surgeon: Felicie Morn, MD;  Location: [facility];  Service: General;  Laterality: N/A;  ? UPPER GI ENDOSCOPY N/A 10/15/2020  ? Procedure: UPPER GI ENDOSCOPY;  Surgeon: Felicie Morn, MD;  Location: [facility] ORS;  Service: General;  Laterality: N/A;  ?  ?Current Facility-Administered Medications  ?Medication Dose Route Frequency Provider Last Rate Last Admin  ? tranexamic acid (CYKLOKAPRON) 2,000 mg in sodium chloride 0.9 % 50 mL Topical Application  1,497 mg Topical To OR Newt Minion, MD      ? ?Current Outpatient Medications  ?Medication Sig Dispense Refill Last Dose  ? acetaminophen (TYLENOL) 500 MG tablet Take 1,000 mg by mouth every 6 (six) hours as needed for headache.     ? aspirin EC 81 MG tablet Take 81 mg by mouth in the morning.     ? atorvastatin (LIPITOR) 80 MG tablet Take 80 mg by mouth in the morning.  5   ? BIOTIN PO Take 1 tablet by mouth in the morning.     ? CARTIA XT 240 MG 24 hr capsule Take 240 mg by mouth daily.  5   ? doxycycline (VIBRAMYCIN) 50 MG capsule Take 50 mg by mouth daily as needed (rosacea flare ups).     ? ezetimibe (ZETIA) 10 MG tablet Take 1 tablet (10 mg total) by mouth daily. 90 tablet 2   ? lisinopril-hydrochlorothiazide (ZESTORETIC) 20-12.5 MG tablet Take 1 tablet by mouth daily.     ? ?No Known Allergies  ?Social History  ? ?Tobacco Use  ? Smoking  status: Never  ? Smokeless tobacco: Never  ?Substance Use Topics  ? Alcohol use: Not Currently  ?  ?Family History  ?Problem Relation Age of Onset  ? Diabetes Father   ? Arthritis Father   ? Hypertension Father   ? Hypercalcemia Maternal Grandfather   ?  ? ?Review of Systems  ?All other systems reviewed and are negative. ? ?Objective: ? ?Physical Exam ? ?Vital signs in last 24 hours: ?  ? ?Labs: ? ? ?Estimated body mass index is 39.57 kg/m? as calculated from the following: ?  Height as of 04/16/21: '6\' 2"'$  (1.88 m). ?  Weight as of 04/16/21: 139.8 kg. ? ? ?Imaging  Review ?Plain radiographs demonstrate moderate degenerative joint disease of the left knee(s). The overall alignment ismild varus. The bone quality appears to be satisfactory for age and reported activity level. ? ? ? ? ? ?Assessment/Plan: ? ?End stage arthritis, left knee  ? ?The patient history, physical examination, clinical judgment of the provider and imaging studies are consistent with end stage degenerative joint disease of the left knee(s) and total knee arthroplasty is deemed medically necessary. The treatment options including medical management, injection therapy arthroscopy and arthroplasty were discussed at length. The risks and benefits of total knee arthroplasty were presented and reviewed. The risks due to aseptic loosening, infection, stiffness, patella tracking problems, thromboembolic complications and other imponderables were discussed. The patient acknowledged the explanation, agreed to proceed with the plan and consent was signed. Patient is being admitted for inpatient treatment for surgery, pain control, PT, OT, prophylactic antibiotics, VTE prophylaxis, progressive ambulation and ADL's and discharge planning. The patient is planning to be discharged home with home health services ? ? ? ? ?Patient's anticipated LOS is less than 2 midnights, meeting these requirements: ?- Younger than 41 ?- Lives within 1 hour of care ?- Has a competent adult at home to recover with post-op recover ?- NO history of ? - Chronic pain requiring opiods ? - Diabetes ? - Coronary Artery Disease ? - Heart failure ? - Heart attack ? - Stroke ? - DVT/VTE ? - Cardiac arrhythmia ? - Respiratory Failure/COPD ? - Renal failure ? - Anemia ? - Advanced Liver disease ? ? ?

## 2021-04-25 NOTE — Transfer of Care (Signed)
Immediate Anesthesia Transfer of Care Note ? ?Patient: Nathan Pope ? ?Procedure(s) Performed: RIGHT TOTAL KNEE ARTHROPLASTY (Right: Knee) ? ?Patient Location: PACU ? ?Anesthesia Type:MAC and Spinal ? ?Level of Consciousness: awake, patient cooperative and responds to stimulation ? ?Airway & Oxygen Therapy: Patient Spontanous Breathing and Patient connected to nasal cannula oxygen ? ?Post-op Assessment: Report given to RN and Post -op Vital signs reviewed and stable ? ?Post vital signs: Reviewed and stable ? ?Last Vitals:  ?Vitals Value Taken Time  ?BP 102/65 04/25/21 1100  ?Temp    ?Pulse 76 04/25/21 1059  ?Resp 13 04/25/21 1059  ?SpO2 94 % 04/25/21 1059  ?Vitals shown include unvalidated device data. ? ?Last Pain:  ?Vitals:  ? 04/25/21 0855  ?TempSrc:   ?PainSc: 0-No pain  ?   ? ?Patients Stated Pain Goal: 1 (04/25/21 0710) ? ?Complications: No notable events documented. ?

## 2021-04-25 NOTE — Plan of Care (Signed)

## 2021-04-25 NOTE — Anesthesia Procedure Notes (Signed)
Anesthesia Regional Block: Adductor canal block  ? ?Pre-Anesthetic Checklist: , timeout performed,  Correct Patient, Correct Site, Correct Laterality,  Correct Procedure, Correct Position, site marked,  Risks and benefits discussed,  Surgical consent,  Pre-op evaluation,  At surgeon's request and post-op pain management ? ?Laterality: Right ? ?Prep: chloraprep     ?  ?Needles:  ?Injection technique: Single-shot ? ?Needle Type: Echogenic Stimulator Needle   ? ? ?Needle Length: 10cm  ?Needle Gauge: 20  ? ? ? ?Additional Needles: ? ? ?Procedures:,,,, ultrasound used (permanent image in chart),,    ?Narrative:  ?Start time: 04/25/2021 8:45 AM ?End time: 04/25/2021 8:48 AM ?Injection made incrementally with aspirations every 5 mL. ? ?Performed by: Personally  ?Anesthesiologist: Lidia Collum, MD ? ?Additional Notes: ?Standard monitors applied. Skin prepped. Good needle visualization with ultrasound. Injection made in 5cc increments with no resistance to injection. Patient tolerated the procedure well. ? ? ? ? ?

## 2021-04-25 NOTE — Anesthesia Postprocedure Evaluation (Signed)
Anesthesia Post Note ? ?Patient: Nathan Pope ? ?Procedure(s) Performed: RIGHT TOTAL KNEE ARTHROPLASTY (Right: Knee) ? ?  ? ?Patient location during evaluation: PACU ?Anesthesia Type: Spinal ?Level of consciousness: oriented and awake and alert ?Pain management: pain level controlled ?Vital Signs Assessment: post-procedure vital signs reviewed and stable ?Respiratory status: spontaneous breathing, respiratory function stable and nonlabored ventilation ?Cardiovascular status: blood pressure returned to baseline and stable ?Postop Assessment: no headache, no backache, no apparent nausea or vomiting and spinal receding ?Anesthetic complications: no ? ? ?No notable events documented. ? ?Last Vitals:  ?Vitals:  ? 04/25/21 1412 04/25/21 1418  ?BP: 95/68 95/68  ?Pulse: (!) 57 63  ?Resp: 15 17  ?Temp:  (!) 36.4 ?C  ?SpO2:  97%  ?  ?Last Pain:  ?Vitals:  ? 04/25/21 1419  ?TempSrc:   ?PainSc: 7   ? ? ?  ?  ?  ?  ?  ?  ? ?Lidia Collum ? ? ? ? ?

## 2021-04-26 DIAGNOSIS — M1711 Unilateral primary osteoarthritis, right knee: Secondary | ICD-10-CM | POA: Diagnosis not present

## 2021-04-26 MED ORDER — OXYCODONE-ACETAMINOPHEN 5-325 MG PO TABS: 1.0000 | ORAL_TABLET | ORAL | 0 refills | Status: DC | PRN

## 2021-04-26 NOTE — Progress Notes (Signed)
Patient ID: Nathan Pope, male   DOB: [birth year], 61 y.o.   MRN: [id number] ?Patient states that he started having pain in his knee last night.  He states he has been working on extension.  Plan for discharge to home after therapy today.  Continue using the ice machine. ?

## 2021-04-26 NOTE — Discharge Summary (Signed)
Discharge Diagnoses:  ?Principal Problem: ?  Total knee replacement status, right ?Active Problems: ?  Unilateral primary osteoarthritis, right knee ? ? ?Surgeries: Procedure(s): ?RIGHT TOTAL KNEE ARTHROPLASTY on 04/25/2021  ?  ?Consultants:  ? ?Discharged Condition: Improved ? ?Hospital Course: Nathan Pope is an 61 y.o. male who was admitted 04/25/2021 with a chief complaint of osteoarthritis right knee, with a final diagnosis of Osteoarthritis Right Knee.  Patient was brought to the operating room on 04/25/2021 and underwent Procedure(s): ?RIGHT TOTAL KNEE ARTHROPLASTY.   ? ?Patient was given perioperative antibiotics:  ?Anti-infectives (From admission, onward)  ? ? Start     Dose/Rate Route Frequency Ordered Stop  ? 04/25/21 1500  ceFAZolin (ANCEF) IVPB 2g/100 mL premix       ? 2 g ?200 mL/hr over 30 Minutes Intravenous Every 6 hours 04/25/21 1411 04/25/21 2255  ? 04/25/21 0700  ceFAZolin (ANCEF) IVPB 3g/100 mL premix       ? 3 g ?200 mL/hr over 30 Minutes Intravenous On call to O.R. 04/25/21 1610 04/25/21 0913  ? ?  ?. ? ?Patient was given sequential compression devices, early ambulation, and aspirin for DVT prophylaxis. ? ?Recent vital signs: Patient Vitals for the past 24 hrs: ? BP Temp Temp src Pulse Resp SpO2  ?04/26/21 0758 122/84 98.5 ?F (36.9 ?C) Oral 89 16 94 %  ?04/26/21 0432 121/76 98.1 ?F (36.7 ?C) -- 88 14 94 %  ?04/25/21 2341 95/60 98.1 ?F (36.7 ?C) -- 66 14 90 %  ?04/25/21 2300 -- 98.1 ?F (36.7 ?C) Oral -- -- --  ?04/25/21 1958 94/62 97.8 ?F (36.6 ?C) Oral 67 16 96 %  ?04/25/21 1900 107/70 -- -- -- -- --  ?04/25/21 1740 -- -- -- -- -- 96 %  ?04/25/21 1712 106/67 (!) 97.5 ?F (36.4 ?C) Oral 66 17 97 %  ?04/25/21 1418 95/68 (!) 97.5 ?F (36.4 ?C) Oral 63 17 97 %  ?04/25/21 1412 95/68 -- -- (!) 57 15 --  ?04/25/21 1345 110/72 97.7 ?F (36.5 ?C) -- (!) 52 13 96 %  ?04/25/21 1315 119/70 -- -- (!) 56 15 96 %  ?04/25/21 1300 109/72 -- -- (!) 56 15 94 %  ?04/25/21 1245 100/66 -- -- (!) 54 14 98 %  ?04/25/21  1230 105/68 -- -- 60 14 94 %  ?04/25/21 1215 98/66 -- -- 69 17 95 %  ?04/25/21 1200 106/61 97.7 ?F (36.5 ?C) -- 68 16 95 %  ?04/25/21 1145 111/74 -- -- 68 19 99 %  ?04/25/21 1130 103/64 -- -- 73 17 95 %  ?04/25/21 1115 108/63 -- -- 75 17 94 %  ?04/25/21 1100 102/65 (!) 97.5 ?F (36.4 ?C) -- 76 13 94 %  ?04/25/21 0855 108/68 -- -- (!) 49 10 96 %  ?04/25/21 0850 103/68 -- -- (!) 51 11 95 %  ?04/25/21 0845 102/66 -- -- (!) 54 14 94 %  ?. ? ?Recent laboratory studies: No results found. ? ?Discharge Medications:   ?Allergies as of 04/26/2021   ?No Known Allergies ?  ? ?  ?Medication List  ?  ? ?TAKE these medications   ? ?acetaminophen 500 MG tablet ?Commonly known as: TYLENOL ?Take 1,000 mg by mouth every 6 (six) hours as needed for headache. ?  ?aspirin EC 81 MG tablet ?Take 81 mg by mouth in the morning. ?  ?atorvastatin 80 MG tablet ?Commonly known as: LIPITOR ?Take 80 mg by mouth in the morning. ?  ?BIOTIN PO ?Take 1 tablet by  mouth in the morning. ?  ?Cartia XT 240 MG 24 hr capsule ?Generic drug: diltiazem ?Take 240 mg by mouth daily. ?  ?doxycycline 50 MG capsule ?Commonly known as: VIBRAMYCIN ?Take 50 mg by mouth daily as needed (rosacea flare ups). ?  ?ezetimibe 10 MG tablet ?Commonly known as: ZETIA ?Take 1 tablet (10 mg total) by mouth daily. ?  ?lisinopril-hydrochlorothiazide 20-12.5 MG tablet ?Commonly known as: ZESTORETIC ?Take 1 tablet by mouth daily. ?  ?oxyCODONE-acetaminophen 5-325 MG tablet ?Commonly known as: PERCOCET/ROXICET ?Take 1 tablet by mouth every 4 (four) hours as needed. ?  ? ?  ? ? ?Diagnostic Studies: No results found. ? ?Patient benefited maximally from their hospital stay and there were no complications.   ? ? ?Disposition: Discharge disposition: 01-Home or Self Care ? ? ? ? ? ?Discharge Instructions   ? ? Call MD / Call 911   Complete by: As directed ?  ? If you experience chest pain or shortness of breath, CALL 911 and be transported to the hospital emergency room.  If you develope a  fever above 101 F, pus (white drainage) or increased drainage or redness at the wound, or calf pain, call your surgeon's office.  ? Constipation Prevention   Complete by: As directed ?  ? Drink plenty of fluids.  Prune juice may be helpful.  You may use a stool softener, such as Colace (over the counter) 100 mg twice a day.  Use MiraLax (over the counter) for constipation as needed.  ? Diet - low sodium heart healthy   Complete by: As directed ?  ? Elevate operative extremity   Complete by: As directed ?  ? Increase activity slowly as tolerated   Complete by: As directed ?  ? Post-operative opioid taper instructions:   Complete by: As directed ?  ? POST-OPERATIVE OPIOID TAPER INSTRUCTIONS: ?It is important to wean off of your opioid medication as soon as possible. If you do not need pain medication after your surgery it is ok to stop day one. ?Opioids include: ?Codeine, Hydrocodone(Norco, Vicodin), Oxycodone(Percocet, oxycontin) and hydromorphone amongst others.  ?Long term and even short term use of opiods can cause: ?Increased pain response ?Dependence ?Constipation ?Depression ?Respiratory depression ?And more.  ?Withdrawal symptoms can include ?Flu like symptoms ?Nausea, vomiting ?And more ?Techniques to manage these symptoms ?Hydrate well ?Eat regular healthy meals ?Stay active ?Use relaxation techniques(deep breathing, meditating, yoga) ?Do Not substitute Alcohol to help with tapering ?If you have been on opioids for less than two weeks and do not have pain than it is ok to stop all together.  ?Plan to wean off of opioids ?This plan should start within one week post op of your joint replacement. ?Maintain the same interval or time between taking each dose and first decrease the dose.  ?Cut the total daily intake of opioids by one tablet each day ?Next start to increase the time between doses. ?The last dose that should be eliminated is the evening dose.  ? ?  ? ?  ? ? Follow-up Information   ? ? Newt Minion, MD Follow up in 1 week(s).   ?Specialty: Orthopedic Surgery ?Contact information: ?[address] ?[place] [address] ?[phone] ? ? ?  ?  ? ?  ?  ? ?  ? ? ? ?Signed: ?Newt Minion ?04/26/2021, 8:42 AM  ? ?

## 2021-04-26 NOTE — Progress Notes (Signed)
Physical Therapy Treatment ?Patient Details ?Name: Nathan Pope ?MRN: [id number] ?DOB: [birth year] ?Today's Date: 04/26/2021 ? ? ?History of Present Illness Nathan Pope is a 61 y.o. male presenting to [facility] 04/25/21 s/p R TKA. PMH includes HTN, CAD, melonoma, anxiety ? ?  ?PT Comments  ? ? Am session: Pt received supine and agreeable to session focused on progression of ambulation with RW with steady progress towards acute goals. Pt mod I to come for bed mobility, and light min a for transfers using RW. Despite nausea pt motivated for ambulation, needing min guard for for safety, with light cueing for sequencing at start. Plan to initiate stair training in PM session for safe d/c to home. Pt continues to benefit from skilled PT services to progress toward functional mobility goals.   ?  ?Recommendations for follow up therapy are one component of a multi-disciplinary discharge planning process, led by the attending physician.  Recommendations may be updated based on patient status, additional functional criteria and insurance authorization. ? ?Follow Up Recommendations ? Follow physician's recommendations for discharge plan and follow up therapies ?  ?  ?Assistance Recommended at Discharge Intermittent Supervision/Assistance  ?Patient can return home with the following A little help with walking and/or transfers;Assistance with cooking/housework;Assist for transportation;Help with stairs or ramp for entrance ?  ?Equipment Recommendations ? None recommended by PT  ?  ?Recommendations for Other Services   ? ? ?  ?Precautions / Restrictions Precautions ?Precautions: Fall;Knee ?Precaution Booklet Issued: Yes (comment) ?Precaution Comments: verbally reviewed no pillow underneath knee and given handout with exercise program. ?Restrictions ?Weight Bearing Restrictions: Yes ?RLE Weight Bearing: Weight bearing as tolerated  ?  ? ?Mobility ? Bed Mobility ?Overal bed mobility: Modified Independent ?  ?  ?  ?  ?  ?  ?General bed mobility  comments: Pt nauseous upon sitting up, no vomiting, resolved in short time. Pt mod I required increased time with use of bed rails to get to EOB. In sitting pt nauseus and dry heaving for ~2 minutes, declined wanting to return to supine. ?  ? ?Transfers ?Overall transfer level: Needs assistance ?Equipment used: Rolling walker (2 wheels) ?Transfers: Sit to/from Stand, Bed to chair/wheelchair/BSC ?Sit to Stand: Min assist ?  ?  ?  ?  ?  ?General transfer comment: cues needed for hand placement and reminded that he is WBAT through RLE. Pt required cues to safely back up to and lower down to recliner. min a to power up ?  ? ?Ambulation/Gait ?Ambulation/Gait assistance: Min guard, Min assist (assist for cues) ?Gait Distance (Feet): 50 Feet ?Assistive device: Rolling walker (2 wheels) ?Gait Pattern/deviations: Step-to pattern, Decreased step length - right, Decreased step length - left, Decreased weight shift to right, Antalgic, Trunk flexed ?  ?  ?  ?General Gait Details: slow antalgic gait, cues for upright trunk and LE sequencing at start but good carryover throughout ambulation, good RW management, no LOB, distance limited by pain despite premedication ? ? ?Stairs ?  ?  ?  ?  ?  ? ? ?Wheelchair Mobility ?  ? ?Modified Rankin (Stroke Patients Only) ?  ? ? ?  ?Balance Overall balance assessment: Needs assistance ?Sitting-balance support: No upper extremity supported, Feet supported ?Sitting balance-Leahy Scale: Good ?  ?  ?Standing balance support: Bilateral upper extremity supported, During functional activity, Reliant on assistive device for balance ?Standing balance-Leahy Scale: Poor ?Standing balance comment: pt reliant on RW for UE support to offweight RLE due to pain ?  ?  ?  ?  ?  ?  ?  ?  ?  ?  ?  ?  ? ?  ?  Cognition Arousal/Alertness: Awake/alert ?Behavior During Therapy: [facility] for tasks assessed/performed ?Overall Cognitive Status: Within Functional Limits for tasks assessed ?  ?  ?  ?  ?  ?  ?  ?  ?  ?  ?  ?  ?   ?  ?  ?  ?  ?  ?  ? ?  ?Exercises Total Joint Exercises ?Ankle Circles/Pumps: Both, 10 reps, Seated ?Heel Slides: AROM, AAROM, Right, 5 reps ?Hip ABduction/ADduction: AROM, AAROM, Right, 10 reps ?Straight Leg Raises: AROM, AAROM, Right, 10 reps ? ?  ?General Comments   ?  ?  ? ?Pertinent Vitals/Pain Pain Assessment ?Pain Assessment: 0-10 ?Pain Score: 8  ?Pain Location: R knee ?Pain Descriptors / Indicators: Aching, Grimacing, Discomfort ?Pain Intervention(s): Limited activity within patient's tolerance, Monitored during session, Premedicated before session, Repositioned, Ice applied  ? ? ?Home Living   ?  ?  ?  ?  ?  ?  ?  ?  ?  ?   ?  ?Prior Function    ?  ?  ?   ? ?PT Goals (current goals can now be found in the care plan section) Acute Rehab PT Goals ?Patient Stated Goal: to return home ?PT Goal Formulation: With patient ?Time For Goal Achievement: 05/09/21 ? ?  ?Frequency ? ? ? 7X/week ? ? ? ?  ?PT Plan    ? ? ?Co-evaluation   ?  ?  ?  ?  ? ?  ?AM-PAC PT "6 Clicks" Mobility   ?Outcome Measure ? Help needed turning from your back to your side while in a flat bed without using bedrails?: None ?Help needed moving from lying on your back to sitting on the side of a flat bed without using bedrails?: None ?Help needed moving to and from a bed to a chair (including a wheelchair)?: A Little ?Help needed standing up from a chair using your arms (e.g., wheelchair or bedside chair)?: A Little ?Help needed to walk in hospital room?: A Little ?Help needed climbing 3-5 steps with a railing? : A Lot ?6 Click Score: 19 ? ?  ?End of Session Equipment Utilized During Treatment: Gait belt ?Activity Tolerance: Patient tolerated treatment well;Other (comment) (nausea) ?Patient left: in chair;with chair alarm set;with call bell/phone within reach;with family/visitor present ?Nurse Communication: Mobility status ?PT Visit Diagnosis: Other abnormalities of gait and mobility (R26.89);Unsteadiness on feet (R26.81);Difficulty in walking,  not elsewhere classified (R26.2);Pain ?Pain - Right/Left: Right ?Pain - part of body: Knee ?  ? ? ?Time: 8657-8469 ?PT Time Calculation (min) (ACUTE ONLY): 28 min ? ?Charges:  $Gait Training: 8-22 mins ?$Therapeutic Exercise: 8-22 mins          ?          ? ?Audry Riles. PTA ?Acute Rehabilitation Services ?Office: [phone] ? ? ? ?Betsey Holiday Alarik Radu ?04/26/2021, 9:33 AM ? ?

## 2021-04-26 NOTE — Progress Notes (Signed)
Patient was cleared by PT.  Discharged instructions given to the patient and his spouse.  Both verbalized understanding.  Encouraged to call the doctor for questions.    Needs addressed.  Discharged home. ?

## 2021-04-26 NOTE — Progress Notes (Addendum)
Discharge order in place.  PT pending discharge at this time.  Patient scheduled to get a 2nd session with PT around lunch time. ?

## 2021-04-26 NOTE — Progress Notes (Signed)
Physical Therapy Treatment ?Patient Details ?Name: Nathan Pope ?MRN: [id number] ?DOB: 04/19/1960 ?Today's Date: 04/26/2021 ? ? ?History of Present Illness Nathan Pope is a 61 y.o. male presenting to [facility] 04/25/21 s/p R TKA. PMH includes HTN, CAD, melonoma, anxiety ? ?  ?PT Comments  ? ? PM session: Pt seen for follow up session with focus on safe stair ascent/descent with RW for safe d/c to home. Pt min assist down to min guard for stair training as pt able to ascend/descend second trial without cueing. Pt min guard for ambulation from therapy gym back to room with light cueing needed for upright trunk. Answered all pt and spouse questions, educated pt re; safe car entry/exit, HEP compliance, ice, and increasing activity tolerance, pt and spouse verbalizing understanding.  Anticipate safe discharge with family assistance once medically cleared, will follow acutely. Pt continues to benefit from skilled PT services to progress toward functional mobility goals.   ?  ?Recommendations for follow up therapy are one component of a multi-disciplinary discharge planning process, led by the attending physician.  Recommendations may be updated based on patient status, additional functional criteria and insurance authorization. ? ?Follow Up Recommendations ? Follow physician's recommendations for discharge plan and follow up therapies ?  ?  ?Assistance Recommended at Discharge Intermittent Supervision/Assistance  ?Patient can return home with the following A little help with walking and/or transfers;Assistance with cooking/housework;Assist for transportation;Help with stairs or ramp for entrance ?  ?Equipment Recommendations ? None recommended by PT  ?  ?Recommendations for Other Services   ? ? ?  ?Precautions / Restrictions Precautions ?Precautions: Fall;Knee ?Precaution Booklet Issued: Yes (comment) ?Precaution Comments: verbally reviewed no pillow underneath knee and given handout with exercise program. ?Restrictions ?Weight  Bearing Restrictions: Yes ?RLE Weight Bearing: Weight bearing as tolerated  ?  ? ?Mobility ? Bed Mobility ?Overal bed mobility: Modified Independent ?  ?  ?  ?  ?  ?  ?General bed mobility comments: pt OOB in recliner on arrival ?  ? ?Transfers ?Overall transfer level: Needs assistance ?Equipment used: Rolling walker (2 wheels) ?Transfers: Sit to/from Stand, Bed to chair/wheelchair/BSC ?Sit to Stand: Min guard ?  ?  ?  ?  ?  ?General transfer comment: min guard for safety ?  ? ?Ambulation/Gait ?Ambulation/Gait assistance: Min guard (assist for cues) ?Gait Distance (Feet): 90 Feet ?Assistive device: Rolling walker (2 wheels) ?Gait Pattern/deviations: Step-to pattern, Decreased step length - right, Decreased step length - left, Decreased weight shift to right, Antalgic, Trunk flexed ?  ?  ?  ?General Gait Details: light cues for upright trunk, good RW management, no LOB, distance limited by pain despite premedication ? ? ?Stairs ?Stairs: Yes ?Stairs assistance: Min assist, Min guard ?Stair Management: No rails, Backwards, With walker, Step to pattern ?Number of Stairs: 4 ?General stair comments: up and down stairs in therapy gym, able to ascend/descend x1 with cueing and again without cueing with good recall of sequencing of RW and LE. pt negotiating backwards as 2 steps to enter home have no rail ? ? ?Wheelchair Mobility ?  ? ?Modified Rankin (Stroke Patients Only) ?  ? ? ?  ?Balance Overall balance assessment: Needs assistance ?Sitting-balance support: No upper extremity supported, Feet supported ?Sitting balance-Leahy Scale: Good ?  ?  ?Standing balance support: Bilateral upper extremity supported, During functional activity, Reliant on assistive device for balance ?Standing balance-Leahy Scale: Poor ?Standing balance comment: pt reliant on RW for UE support to offweight RLE due to pain ?  ?  ?  ?  ?  ?  ?  ?  ?  ?  ?  ?  ? ?  ?  Cognition Arousal/Alertness: Awake/alert ?Behavior During Therapy: [facility] for tasks  assessed/performed ?Overall Cognitive Status: Within Functional Limits for tasks assessed ?  ?  ?  ?  ?  ?  ?  ?  ?  ?  ?  ?  ?  ?  ?  ?  ?  ?  ?  ? ?  ?Exercises Total Joint Exercises ?Ankle Circles/Pumps: Both, 10 reps, Seated ?Heel Slides: AROM, AAROM, Right, 5 reps ?Hip ABduction/ADduction: AROM, AAROM, Right, 10 reps ?Straight Leg Raises: AROM, AAROM, Right, 10 reps ? ?  ?General Comments   ?  ?  ? ?Pertinent Vitals/Pain Pain Assessment ?Pain Assessment: Faces ?Pain Score: 8  ?Faces Pain Scale: Hurts little more ?Pain Location: R knee ?Pain Descriptors / Indicators: Aching, Grimacing, Discomfort ?Pain Intervention(s): Limited activity within patient's tolerance, Monitored during session  ? ? ?Home Living   ?  ?  ?  ?  ?  ?  ?  ?  ?  ?   ?  ?Prior Function    ?  ?  ?   ? ?PT Goals (current goals can now be found in the care plan section) Acute Rehab PT Goals ?Patient Stated Goal: to return home ?PT Goal Formulation: With patient ?Time For Goal Achievement: 05/09/21 ? ?  ?Frequency ? ? ? 7X/week ? ? ? ?  ?PT Plan    ? ? ?Co-evaluation   ?  ?  ?  ?  ? ?  ?AM-PAC PT "6 Clicks" Mobility   ?Outcome Measure ? Help needed turning from your back to your side while in a flat bed without using bedrails?: None ?Help needed moving from lying on your back to sitting on the side of a flat bed without using bedrails?: None ?Help needed moving to and from a bed to a chair (including a wheelchair)?: A Little ?Help needed standing up from a chair using your arms (e.g., wheelchair or bedside chair)?: A Little ?Help needed to walk in hospital room?: A Little ?Help needed climbing 3-5 steps with a railing? : A Lot ?6 Click Score: 19 ? ?  ?End of Session Equipment Utilized During Treatment: Gait belt ?Activity Tolerance: Patient tolerated treatment well ?Patient left: in chair;with call bell/phone within reach;with family/visitor present ?Nurse Communication: Mobility status;Other (comment) (pt safe for d/c) ?PT Visit Diagnosis:  Other abnormalities of gait and mobility (R26.89);Unsteadiness on feet (R26.81);Difficulty in walking, not elsewhere classified (R26.2);Pain ?Pain - Right/Left: Right ?Pain - part of body: Knee ?  ? ? ?Time: 9937-1696 ?PT Time Calculation (min) (ACUTE ONLY): 25 min ? ?Charges:  $Gait Training: 23-37 mins          ?          ? ?Audry Riles. PTA ?Acute Rehabilitation Services ?Office: [phone] ? ? ? ?Betsey Holiday Kassidy Dockendorf ?04/26/2021, 1:54 PM ? ?

## 2021-04-28 ENCOUNTER — Encounter (HOSPITAL_COMMUNITY): Payer: Self-pay | Admitting: Orthopedic Surgery

## 2021-04-28 ENCOUNTER — Telehealth: Payer: Self-pay | Admitting: Orthopedic Surgery

## 2021-04-28 NOTE — Telephone Encounter (Signed)
Patient called. He says he still has not heard from anyone to start in home physical therapy. Would like a call back letting him know what's going on with that. His call back number is [phone] ?

## 2021-04-28 NOTE — Telephone Encounter (Signed)
Sent message to Sonia Side at [facility] to see if he got our referral from last week. Awaiting reply back. ?

## 2021-04-29 ENCOUNTER — Telehealth: Payer: Self-pay | Admitting: Family

## 2021-04-29 NOTE — Telephone Encounter (Signed)
Patient called advised he was told someone would call him to set up HHPT. Patient said he need to set up HHPT in the home as soon as possible. The number to contact patient is [phone] ?

## 2021-04-29 NOTE — Telephone Encounter (Signed)
Can you call Nathan Pope? He was supposed to call us and let us know yesterday if they were able to staff this referral. Pt is a total knee and needs to have therapy sch this week as he is home already from the hospital. If [facility] can not staff this pt then we need to call another agency for urgent referral. ?

## 2021-04-29 NOTE — Telephone Encounter (Signed)
SW Sonia Side, they cannot service pt at this time. I did call Levada Dy at [facility] and she will pull up his chart and see if they have availability and will call me back for a status update. Pt is aware. ?

## 2021-04-29 NOTE — Telephone Encounter (Signed)
I just SW pt this morning and stated this is being worked on. Levada Dy with [facility] is calling us back on the status. Closing out this message as one is already open on this. ?

## 2021-04-29 NOTE — Telephone Encounter (Signed)
Reached out to Hankinson at [facility] to check the status of [facility] referral. ?

## 2021-04-30 ENCOUNTER — Telehealth: Payer: Self-pay | Admitting: Orthopedic Surgery

## 2021-04-30 NOTE — Telephone Encounter (Signed)
See next note on this. Closing out and sending in referral. ?

## 2021-04-30 NOTE — Telephone Encounter (Signed)
faxing

## 2021-04-30 NOTE — Telephone Encounter (Signed)
Patient called advised he found a place that takes his insurance. Patient asked if an order can be sent to [facility] in [place]    Fax# is [phone]   The phone number is [phone]     The number to contact patient is [phone] ?

## 2021-04-30 NOTE — Telephone Encounter (Signed)
[facility] called back, they cannot take his insurance as they do not have a contract with [employer]. I offered outpatient PT at our office. He wouldn't have a way the rest of the week and wouldn't be able to start until next week. Advised we need to have PT see him this week. I will continue to check with local [facility] agencies today. He does live in [place] so need to make sure they can service him out there. Patient is aware of the plan. ?

## 2021-04-30 NOTE — Telephone Encounter (Signed)
Order printed out and will fax to them today. ?

## 2021-05-01 ENCOUNTER — Telehealth: Payer: Self-pay | Admitting: Family

## 2021-05-01 NOTE — Telephone Encounter (Signed)
Deb Dabbs (PT) called from [facility] called for physical therapy home health verbal orders for 2 wk 6. Deb also needs clarification on blood thinner meds. Pt needed home health but no one accepts pt insurance. Deb states she is a mobile clinic that services for pt at pt's home.She also states they do have clinic service once pt finishes pt home health unless no transportation. Deb also has stated pt is a big guy and need clarification for compression hose. Please call Deb at [phone] if unable to answer please leave vm on secure line. ?

## 2021-05-02 ENCOUNTER — Telehealth: Payer: Self-pay | Admitting: Family

## 2021-05-02 NOTE — Telephone Encounter (Signed)
Just take 81 mg aspirin twice daily should be fine

## 2021-05-02 NOTE — Telephone Encounter (Signed)
I called pt and advised of message below.  ?

## 2021-05-02 NOTE — Telephone Encounter (Signed)
I called and sw PT advised ok for orders requested below. Pt had ted hose and has been wearing these. Also advised of pt toaking 80 mg ASA x2 a day for thinner. Will call with any other questions.  ?

## 2021-05-02 NOTE — Telephone Encounter (Signed)
This pt is s/p a total knee replacement and was taking a BASA prior to surgery which he stopped before procedure and never resumed. He is calling and asking if he should take the 81 mg or bump up to a full 325 mg. Please advise.  ?

## 2021-05-02 NOTE — Telephone Encounter (Signed)
Pt called requesting a call back. Pt has medication questions. Please call pt at [phone].  ?

## 2021-05-06 ENCOUNTER — Ambulatory Visit: Payer: Self-pay

## 2021-05-06 ENCOUNTER — Telehealth: Payer: Self-pay

## 2021-05-06 ENCOUNTER — Ambulatory Visit (INDEPENDENT_AMBULATORY_CARE_PROVIDER_SITE_OTHER): Payer: BLUE CROSS/BLUE SHIELD

## 2021-05-06 ENCOUNTER — Ambulatory Visit (INDEPENDENT_AMBULATORY_CARE_PROVIDER_SITE_OTHER): Payer: BLUE CROSS/BLUE SHIELD | Admitting: Family

## 2021-05-06 ENCOUNTER — Encounter: Payer: Self-pay | Admitting: Family

## 2021-05-06 DIAGNOSIS — M25562 Pain in left knee: Secondary | ICD-10-CM

## 2021-05-06 DIAGNOSIS — M25561 Pain in right knee: Secondary | ICD-10-CM

## 2021-05-06 DIAGNOSIS — M1711 Unilateral primary osteoarthritis, right knee: Secondary | ICD-10-CM

## 2021-05-06 MED ORDER — OXYCODONE-ACETAMINOPHEN 5-325 MG PO TABS: 1.0000 | ORAL_TABLET | ORAL | 0 refills | Status: DC | PRN

## 2021-05-06 NOTE — Telephone Encounter (Signed)
Prior auth started through cover my meds. Will hold this message pending approval.  ?

## 2021-05-06 NOTE — Progress Notes (Signed)
? ?Post-Op Visit Note ?  ?Patient: Nathan Pope           ?Date of Birth: February 19, 1960           ?MRN: [id number] ?Visit Date: 05/06/2021 ?PCP: Caryl Bis, MD ? ?Chief Complaint:  ?Chief Complaint  ?Patient presents with  ? Right Knee - Routine Post Op  ?  04/25/2021 right total knee replacement   ? ? ?HPI:  ?HPI ?The patient is a 61 year old gentleman who presents status post right total knee arthroplasty on April 21 has been doing quite well he is active in physical therapy ? ?Ortho Exam ?On examination of the right knee his incision is well approximated with staples is well-healed this is clean dry and intact he has near full extension and flexion to 80. ? ?Visit Diagnoses:  ?1. Unilateral primary osteoarthritis, right knee   ?2. Left knee pain, unspecified chronicity   ? ? ?Plan: He will continue with his physical therapy.  Staples harvested today.  He will follow-up in the office in 4 weeks ? ?Follow-Up Instructions: No follow-ups on file.  ? ?Imaging: ?No results found. ? ?Orders:  ?Orders Placed This Encounter  ?Procedures  ? XR Knee 1-2 Views Right  ? XR Knee 1-2 Views Left  ? ?Meds ordered this encounter  ?Medications  ? oxyCODONE-acetaminophen (PERCOCET/ROXICET) 5-325 MG tablet  ?  Sig: Take 1 tablet by mouth every 4 (four) hours as needed.  ?  Dispense:  30 tablet  ?  Refill:  0  ? ? ? ?PMFS History: ?Patient Active Problem List  ? Diagnosis Date Noted  ? Total knee replacement status, right 04/25/2021  ? Unilateral primary osteoarthritis, right knee   ? Morbid obesity with BMI of 50.0-59.9, adult ([place]) 10/15/2020  ? HYPERLIPIDEMIA 09/05/2008  ? HYPERTENSION 09/05/2008  ? CAD 09/05/2008  ? IRREGULAR HEART RATE 09/05/2008  ? ?Past Medical History:  ?Diagnosis Date  ? Anxiety   ? Arthritis   ? Cancer [facility])   ? 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 Coronary artery disease   ? Hypercholesterolemia   ? Hypertension   ? Obesity   ? Osteoarthritis   ? PONV (postoperative nausea and vomiting)   ? Sleep apnea    ?  ?Family History  ?Problem Relation Age of Onset  ? Diabetes Father   ? Arthritis Father   ? Hypertension Father   ? Hypercalcemia Maternal Grandfather   ?  ?Past Surgical History:  ?Procedure Laterality Date  ? BARIATRIC SURGERY    ? BIOPSY  08/09/2020  ? Procedure: BIOPSY;  Surgeon: Felicie Morn, MD;  Location: [facility];  Service: General;;  ? CARDIAC SURGERY  2005  ? stent  ? ESOPHAGOGASTRODUODENOSCOPY N/A 08/09/2020  ? Procedure: ESOPHAGOGASTRODUODENOSCOPY (EGD);  Surgeon: Felicie Morn, MD;  Location: [facility];  Service: General;  Laterality: N/A;  ? TOTAL KNEE ARTHROPLASTY Right 04/25/2021  ? Procedure: RIGHT TOTAL KNEE ARTHROPLASTY;  Surgeon: Newt Minion, MD;  Location: [facility];  Service: Orthopedics;  Laterality: Right;  ? UPPER GI ENDOSCOPY N/A 10/15/2020  ? Procedure: UPPER GI ENDOSCOPY;  Surgeon: Felicie Morn, MD;  Location: [facility];  Service: General;  Laterality: N/A;  ? ?Social History  ? ?Occupational History  ? Not on file  ?Tobacco Use  ? Smoking status: Never  ? Smokeless tobacco: Never  ?Vaping Use  ? Vaping Use: Never used  ?Substance and Sexual Activity  ? Alcohol use: Not Currently  ? Drug use: No  ?  Sexual activity: Not on file  ? ? ?

## 2021-05-06 NOTE — Telephone Encounter (Signed)
Checked cover my meds dash board and the pt's medication has been approved.  ?

## 2021-05-23 ENCOUNTER — Telehealth: Payer: Self-pay | Admitting: Family

## 2021-05-23 NOTE — Telephone Encounter (Signed)
Barbara is calling requesting a pain medication refill. He only uses it before PT appts and uses Tylonel any other time. Please advise

## 2021-05-23 NOTE — Telephone Encounter (Signed)
Oxycodone last filled 05/06/21 #30, s/p right total knee replacement 04/25/21

## 2021-05-26 ENCOUNTER — Other Ambulatory Visit: Payer: Self-pay | Admitting: Orthopedic Surgery

## 2021-05-26 MED ORDER — OXYCODONE-ACETAMINOPHEN 5-325 MG PO TABS: 1.0000 | ORAL_TABLET | ORAL | 0 refills | Status: DC | PRN

## 2021-06-03 ENCOUNTER — Encounter: Payer: Self-pay | Admitting: Family

## 2021-06-03 ENCOUNTER — Ambulatory Visit (INDEPENDENT_AMBULATORY_CARE_PROVIDER_SITE_OTHER): Payer: BLUE CROSS/BLUE SHIELD | Admitting: Family

## 2021-06-03 DIAGNOSIS — Z96651 Presence of right artificial knee joint: Secondary | ICD-10-CM

## 2021-06-03 DIAGNOSIS — M1711 Unilateral primary osteoarthritis, right knee: Secondary | ICD-10-CM

## 2021-06-03 MED ORDER — OXYCODONE-ACETAMINOPHEN 5-325 MG PO TABS: 1.0000 | ORAL_TABLET | Freq: Four times a day (QID) | ORAL | 0 refills | Status: DC | PRN

## 2021-06-03 NOTE — Progress Notes (Signed)
   Post-Op Visit Note   Patient: Nathan Pope           Date of Birth: 1960-04-11           MRN: [id number] Visit Date: 06/03/2021 PCP: Caryl Bis, MD  Chief Complaint:  Chief Complaint  Patient presents with   Right Knee - Routine Post Op    04/25/21 right total knee replacement    HPI:  HPI The patient is a 61 year old gentleman who is status post right total knee arthroplasty April 21.  He has been working with physical therapy. Ortho Exam Incision well-healed there is minimal edema.  He has active range of motion from 5 degrees to 105 degrees.  Passively from 0-115.  Visit Diagnoses: No diagnosis found.  Plan: Continue aggressive physical therapy.  We will follow-up with him on an as-needed basis.  Follow-Up Instructions: No follow-ups on file.   Imaging: No results found.  Orders:  No orders of the defined types were placed in this encounter.  No orders of the defined types were placed in this encounter.    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)   Coronary artery disease    Hypercholesterolemia    Hypertension    Obesity    Osteoarthritis    PONV (postoperative nausea and vomiting)    Sleep apnea     Family History  Problem Relation Age of Onset   Diabetes Father    Arthritis Father    Hypertension Father    Hypercalcemia Maternal Grandfather     Past Surgical History:  Procedure Laterality Date   BARIATRIC SURGERY     BIOPSY  08/09/2020   Procedure: BIOPSY;  Surgeon: Felicie Morn, MD;  Location: [facility];  Service:  General;;   CARDIAC SURGERY  2005   stent   ESOPHAGOGASTRODUODENOSCOPY N/A 08/09/2020   Procedure: ESOPHAGOGASTRODUODENOSCOPY (EGD);  Surgeon: Felicie Morn, MD;  Location: [facility];  Service: General;  Laterality: N/A;   TOTAL KNEE ARTHROPLASTY Right 04/25/2021   Procedure: RIGHT TOTAL KNEE ARTHROPLASTY;  Surgeon: Newt Minion, MD;  Location: [facility];  Service: Orthopedics;  Laterality: Right;   UPPER GI ENDOSCOPY N/A 10/15/2020   Procedure: UPPER GI ENDOSCOPY;  Surgeon: Felicie Morn, MD;  Location: [facility];  Service: General;  Laterality: N/A;   Social History   Occupational History   Not on file  Tobacco Use   Smoking status: Never   Smokeless tobacco: Never  Vaping Use   Vaping Use: Never used  Substance and Sexual Activity   Alcohol use: Not Currently   Drug use: No   Sexual activity: Not on file

## 2021-06-04 NOTE — Progress Notes (Unsigned)
Cardiology Office Note:    Date:  06/11/2021   ID:  BRANDON WIECHMAN, DOB 1960-04-27, MRN [id number]  PCP:  Caryl Bis, MD   [facility] Providers Cardiologist:  None {  Referring MD: Caryl Bis, MD    History of Present Illness:    Nathan Pope is a 61 y.o. male with a hx of CAD with history of PCI in LAD, HTN, HLD, and obesity who presents to clinic for follow-up.  Was last seen in clinic on 12/2020 where he was doing very well. Had lost 60lbs following bariatric surgery.   Today, the patient states he overall feels well. Has lost 144lbs since his bariatric surgery. Had his right knee replaced and is working with PT currently. Otherwise he is doing well. No chest pain, SOB, lightheadedness, or dizziness. Has some swelling in the right knee but otherwise no significant edema. Tolerating medications without issue.   Past Medical History:  Diagnosis Date   Anxiety    Arthritis    Cancer ([place])    melanoma removed (mole on stomach)   Coronary artery disease    Hypercholesterolemia    Hypertension    Obesity    Osteoarthritis    PONV (postoperative nausea and vomiting)    Sleep apnea     Past Surgical History:  Procedure Laterality Date   BARIATRIC SURGERY     BIOPSY  08/09/2020   Procedure: BIOPSY;  Surgeon: Felicie Morn, MD;  Location: [facility];  Service: General;;   CARDIAC SURGERY  2005   stent   ESOPHAGOGASTRODUODENOSCOPY N/A 08/09/2020   Procedure: ESOPHAGOGASTRODUODENOSCOPY (EGD);  Surgeon: Felicie Morn, MD;  Location: [facility];  Service: General;  Laterality: N/A;   TOTAL KNEE ARTHROPLASTY Right 04/25/2021   Procedure: RIGHT TOTAL KNEE ARTHROPLASTY;  Surgeon: Newt Minion, MD;  Location: [facility];  Service: Orthopedics;  Laterality: Right;   UPPER GI ENDOSCOPY N/A 10/15/2020   Procedure: UPPER GI ENDOSCOPY;  Surgeon: Felicie Morn, MD;  Location: [facility];  Service: General;  Laterality: N/A;    Current  Medications: Current Meds  Medication Sig   acetaminophen (TYLENOL) 500 MG tablet Take 1,000 mg by mouth every 6 (six) hours as needed for headache.   aspirin EC 81 MG tablet Take 81 mg by mouth in the morning.   atorvastatin (LIPITOR) 80 MG tablet Take 80 mg by mouth in the morning.   BIOTIN PO Take 1 tablet by mouth in the morning.   CARTIA XT 240 MG 24 hr capsule Take 240 mg by mouth daily.   doxycycline (VIBRAMYCIN) 50 MG capsule Take 50 mg by mouth daily as needed (rosacea flare ups).   ezetimibe (ZETIA) 10 MG tablet Take 1 tablet (10 mg total) by mouth daily.   lisinopril-hydrochlorothiazide (ZESTORETIC) 20-12.5 MG tablet Take 1 tablet by mouth daily.   oxyCODONE-acetaminophen (PERCOCET/ROXICET) 5-325 MG tablet Take 1 tablet by mouth every 6 (six) hours as needed.     Allergies:   Patient has no known allergies.   Social History   Socioeconomic History   Marital status: Married    Spouse name: Not on file   Number of children: 3   Years of education: Not on file   Highest education level: Not on file  Occupational History   Not on file  Tobacco Use   Smoking status: Never   Smokeless tobacco: Never  Vaping Use   Vaping Use: Never used  Substance and Sexual Activity   Alcohol use:  Not Currently   Drug use: No   Sexual activity: Not on file  Other Topics Concern   Not on file  Social History Narrative   Not on file   Social Determinants of Health   Financial Resource Strain: Not on file  Food Insecurity: Not on file  Transportation Needs: Not on file  Physical Activity: Not on file  Stress: Not on file  Social Connections: Not on file     Family History: The patient's family history includes Arthritis in his father; Diabetes in his father; Hypercalcemia in his maternal grandfather; Hypertension in his father.  ROS:   Please see the history of present illness.    Review of Systems  Constitutional:  Positive for weight loss. Negative for chills and fever.   HENT:  Negative for sore throat.   Eyes:  Negative for blurred vision.  Respiratory:  Negative for shortness of breath.   Cardiovascular:  Negative for chest pain, palpitations, orthopnea, claudication, leg swelling and PND.  Gastrointestinal:  Negative for nausea and vomiting.  Genitourinary:  Negative for hematuria.  Musculoskeletal:  Positive for joint pain.  Neurological:  Negative for dizziness and loss of consciousness.   EKGs/Labs/Other Studies Reviewed:    The following studies were reviewed today: CTA Dec 25, 2020: FINDINGS: Cardiovascular: Heart is normal size. Scattered coronary artery and aortic calcifications. No evidence of aortic aneurysm. Maximum aortic diameter in the ascending thoracic aorta measures 3.7 cm. No dissection.   Mediastinum/Nodes: No mediastinal, hilar, or axillary adenopathy. Trachea and esophagus are unremarkable. Thyroid unremarkable.   Lungs/Pleura: Lungs are clear. No focal airspace opacities or suspicious nodules. No effusions.   Upper Abdomen: Imaging into the upper abdomen demonstrates no acute findings.   Musculoskeletal: Chest wall soft tissues are unremarkable. No acute bony abnormality.   Review of the MIP images confirms the above findings.   IMPRESSION: No evidence of thoracic aortic aneurysm. Maximum diameter 3.7 cm in the ascending thoracic aorta.   Coronary artery disease.  TTE 07/09/20: IMPRESSIONS   1. Left ventricular ejection fraction, by estimation, is 60 to 65%. The  left ventricle has normal function. The left ventricle has no regional  wall motion abnormalities. The left ventricular internal cavity size was  mildly dilated. There is mild  concentric left ventricular hypertrophy. Left ventricular diastolic  parameters are consistent with Grade I diastolic dysfunction (impaired  relaxation).   2. Right ventricular systolic function is normal. The right ventricular  size is normal.   3. The mitral valve is normal in  structure. No evidence of mitral valve  regurgitation. No evidence of mitral stenosis.   4. The aortic valve is normal in structure. Aortic valve regurgitation is  not visualized. No aortic stenosis is present.   5. Aortic dilatation noted. There is borderline dilatation of the aortic  root, measuring 39 mm. There is borderline dilatation of the ascending  aorta, measuring 39 mm.   6. The inferior vena cava is normal in size with greater than 50%  respiratory variability, suggesting right atrial pressure of 3 mmHg.   7. Trivial pericardial effusion is present. The pericardial effusion is  circumferential.   EKG:  EKG 06/11/21 with sinus bradycardia with HR 59. Low voltage.  Recent Labs: 10/15/2020: ALT 34 04/16/2021: BUN 14; Creatinine, Ser 1.03; Hemoglobin 13.8; Platelets 221; Potassium 4.5; Sodium 140  Recent Lipid Panel    Component Value Date/Time   CHOL 106 12/11/2020 0944   TRIG 103 12/11/2020 0944   HDL 39 (L) 12/11/2020 0944  CHOLHDL 2.7 12/11/2020 0944   LDLCALC 48 12/11/2020 0944      Physical Exam:    VS:  BP 120/72   Pulse (!) 59   Ht '6\' 2"'$  (1.88 m)   Wt 282 lb 9.6 oz (128.2 kg)   SpO2 98%   BMI 36.28 kg/m     Wt Readings from Last 3 Encounters:  06/11/21 282 lb 9.6 oz (128.2 kg)  04/25/21 295 lb (133.8 kg)  04/16/21 (!) 308 lb 3.2 oz (139.8 kg)     GEN:  Comfortable, well appearing HEENT: Normal NECK: No JVD; No carotid bruits CARDIAC: RRR, no murmurs, rubs, gallops RESPIRATORY:  CTAB ABDOMEN: Soft, nontender, nondistended MUSCULOSKELETAL:  No edema; No deformity  SKIN: Warm and dry NEUROLOGIC:  Alert and oriented x 3 PSYCHIATRIC:  Normal affect   ASSESSMENT:    1. Coronary artery disease involving native heart with angina pectoris, unspecified vessel or lesion type ([place])   2. Hyperlipidemia, unspecified hyperlipidemia type   3. Primary hypertension   4. Obesity (BMI 30-39.9)    PLAN:    In order of problems listed above:  #CAD s/p LAD  PCI in 2005: Doing well with no anginal or HF symptoms. Compliant with all medications. LVEF 60-65% on TTE 07/09/20. -Continue ASA '81mg'$  daily -Continue lipitor '80mg'$  daily -Continue lisinopril '20mg'$  daily -Continue zetia '10mg'$  daily  #HTN: Well controlled and at goal <120/80s. -Continue dilt '240mg'$  daily -Continue lisinopril-HCTZ 20-12.'5mg'$  daily -Will need to monitor BP with weight loss as may need less medication  #HLD: LDL 59. Goal <70. -Continue lipitor '80mg'$  daily -Continue zetia '10mg'$  daily -Will repeat labs with PCP  #History of Morbid Obesity with BMI 53 s/p robotic sleeve gastrectomy with BMI 36: S/p bariatric surgery with >144lb weight loss!! BMI now 36. Continuing to work on lifestyle modifications  Medication Adjustments/Labs and Tests Ordered: Current medicines are reviewed at length with the patient today.  Concerns regarding medicines are outlined above.  Orders Placed This Encounter  Procedures   EKG 12-Lead   No orders of the defined types were placed in this encounter.   Patient Instructions  Medication Instructions:   Your physician recommends that you continue on your current medications as directed. Please refer to the Current Medication list given to you today.  *If you need a refill on your cardiac medications before your next appointment, please call your pharmacy*    Follow-Up: At [facility], you and your health needs are our priority.  As part of our continuing mission to provide you with exceptional heart care, we have created designated Provider Care Teams.  These Care Teams include your primary Cardiologist (physician) and Advanced Practice Providers (APPs -  Physician Assistants and Nurse Practitioners) who all work together to provide you with the care you need, when you need it.  We recommend signing up for the patient portal called "MyChart".  Sign up information is provided on this After Visit Summary.  MyChart is used to connect with patients  for Virtual Visits (Telemedicine).  Patients are able to view lab/test results, encounter notes, upcoming appointments, etc.  Non-urgent messages can be sent to your provider as well.   To learn more about what you can do with MyChart, go to [email or web address].    Your next appointment:   6 month(s)  The format for your next appointment:   In Person  Provider:   DR. Johney Frame   Important Information About Sugar          Signed,  Gwyndolyn Kaufman, MD 06/11/2021 8:39 AM    [facility]

## 2021-06-11 ENCOUNTER — Ambulatory Visit (INDEPENDENT_AMBULATORY_CARE_PROVIDER_SITE_OTHER): Payer: BLUE CROSS/BLUE SHIELD | Admitting: Cardiology

## 2021-06-11 ENCOUNTER — Encounter: Payer: Self-pay | Admitting: Cardiology

## 2021-06-11 ENCOUNTER — Other Ambulatory Visit: Payer: BLUE CROSS/BLUE SHIELD

## 2021-06-11 VITALS — BP 120/72 | HR 59 | Ht 74.0 in | Wt 282.6 lb

## 2021-06-11 DIAGNOSIS — E785 Hyperlipidemia, unspecified: Secondary | ICD-10-CM

## 2021-06-11 DIAGNOSIS — I25119 Atherosclerotic heart disease of native coronary artery with unspecified angina pectoris: Secondary | ICD-10-CM

## 2021-06-11 DIAGNOSIS — I10 Essential (primary) hypertension: Secondary | ICD-10-CM

## 2021-06-11 DIAGNOSIS — E669 Obesity, unspecified: Secondary | ICD-10-CM | POA: Diagnosis not present

## 2021-06-11 NOTE — Patient Instructions (Signed)
Medication Instructions:   Your physician recommends that you continue on your current medications as directed. Please refer to the Current Medication list given to you today.  *If you need a refill on your cardiac medications before your next appointment, please call your pharmacy*   Follow-Up: At [facility], you and your health needs are our priority.  As part of our continuing mission to provide you with exceptional heart care, we have created designated Provider Care Teams.  These Care Teams include your primary Cardiologist (physician) and Advanced Practice Providers (APPs -  Physician Assistants and Nurse Practitioners) who all work together to provide you with the care you need, when you need it.  We recommend signing up for the patient portal called "MyChart".  Sign up information is provided on this After Visit Summary.  MyChart is used to connect with patients for Virtual Visits (Telemedicine).  Patients are able to view lab/test results, encounter notes, upcoming appointments, etc.  Non-urgent messages can be sent to your provider as well.   To learn more about what you can do with MyChart, go to [email or web address].    Your next appointment:   6 month(s)  The format for your next appointment:   In Person  Provider:   DR. P[place]ON  Important Information About Sugar       

## 2021-06-18 ENCOUNTER — Other Ambulatory Visit: Payer: Self-pay | Admitting: Orthopedic Surgery

## 2021-06-18 ENCOUNTER — Telehealth: Payer: Self-pay | Admitting: Family

## 2021-06-18 MED ORDER — OXYCODONE-ACETAMINOPHEN 5-325 MG PO TABS: 1.0000 | ORAL_TABLET | Freq: Four times a day (QID) | ORAL | 0 refills | Status: DC | PRN

## 2021-06-18 NOTE — Telephone Encounter (Signed)
Pt called requesting a refill of pain meds. Please send to pharmacy file. Pt states he need it when he has physical therapy session. Please call pt at [phone].

## 2021-06-18 NOTE — Telephone Encounter (Signed)
Pt is s/p a right total knee 04/25/2021 requesting refill on Oxycodone 5/325 last refill was 06/03/21 #30 please advise.

## 2021-07-11 ENCOUNTER — Telehealth: Payer: Self-pay | Admitting: Family

## 2021-07-11 NOTE — Telephone Encounter (Signed)
Pt called requesting a refill of pain medication. Pt states he need for to get him thru his physical therapy sessions. please send to pharmacy on file. Pt phone number is [phone]

## 2021-07-14 ENCOUNTER — Other Ambulatory Visit: Payer: Self-pay | Admitting: Surgical

## 2021-07-14 MED ORDER — OXYCODONE-ACETAMINOPHEN 5-325 MG PO TABS: 1.0000 | ORAL_TABLET | Freq: Every day | ORAL | 0 refills | Status: DC | PRN

## 2021-07-14 NOTE — Telephone Encounter (Signed)
Algis Greenhouse, I sent in a refill for this

## 2021-07-14 NOTE — Telephone Encounter (Signed)
Thank you :)

## 2021-07-14 NOTE — Telephone Encounter (Signed)
Nathan Pope, it says you are on call today, both Dr. Sharol Given and Junie Panning are out of the office. Pt is s/p right knee replacement 04/25/21, last oxycodone RF 06/18/21 #30. Needs this for PT sessions. Can you help?

## 2021-07-15 ENCOUNTER — Telehealth: Payer: Self-pay | Admitting: Family

## 2021-07-15 ENCOUNTER — Other Ambulatory Visit: Payer: Self-pay | Admitting: Physician Assistant

## 2021-07-15 ENCOUNTER — Encounter: Payer: BLUE CROSS/BLUE SHIELD | Attending: Surgery | Admitting: Skilled Nursing Facility1

## 2021-07-15 DIAGNOSIS — Z713 Dietary counseling and surveillance: Secondary | ICD-10-CM | POA: Insufficient documentation

## 2021-07-15 DIAGNOSIS — Z6835 Body mass index (BMI) 35.0-35.9, adult: Secondary | ICD-10-CM | POA: Insufficient documentation

## 2021-07-15 DIAGNOSIS — E669 Obesity, unspecified: Secondary | ICD-10-CM | POA: Diagnosis not present

## 2021-07-15 MED ORDER — OXYCODONE-ACETAMINOPHEN 5-325 MG PO TABS: 1.0000 | ORAL_TABLET | Freq: Every day | ORAL | 0 refills | Status: DC | PRN

## 2021-07-15 NOTE — Telephone Encounter (Signed)
[facility] they said they had electronic issues yesterday and a lot of things that sent to them did not go through their system. Nathan Pope had sent this in yesterday and failed. Dr. Durward Fortes, you are on call. Can you help assistant with resending his oxycodone. He is s/p knee replacement  and needs this for PT

## 2021-07-15 NOTE — Progress Notes (Signed)
Bariatric Nutrition Follow-Up Visit Medical Nutrition Therapy    Pt's Expectations of Surgery/ Goals: Weight Loss, Lower BMI to be eligible for knee surgery  Sx date: October 11th 2022  NUTRITION ASSESSMENT   Anthropometrics  Start weight at [facility]: 420 lbs (date: 06/15/2020) Today's weight: 276.3 pounds    Body Composition Scale 10/29/2020 12/17/2020 07/15/2021  Current Body Weight 376.7 343.3 276.3  Total Body Fat % 41 38.6 32.2  Visceral Fat 41 35 24  Fat-Free Mass % 58.9 61.3 67.7   Total Body Water % 39.9 42.3 48.7  Muscle-Mass lbs 71.2 64.8 52.1  BMI 48.6 44.2 35.5  Body Fat Displacement            Torso  lbs 96 82.4 55.2         Left Leg  lbs 19.2 16.4 11         Right Leg  lbs 19.2 16.4 11         Left Arm  lbs 9.6 8.2 5.5         Right Arm   lbs 9.6 8.2 5.5   Clinical  Medical hx: HTN, HLD, CAD Medications: Lisinopril-HCTZ, Zetia Labs:   Lifestyle & Dietary Hx  Pt states he did get his knee replaced which went well with no more pain and states he will need his other knee done eventually. Pt states he does plan to get back into walking.  Pt states as a snack he will do pieces of his meal like hamburger patty and beans. Pt states he feels good about the food decisions he makes. Pt states he feels good with everything and is proud of himself.  Pt states she wants to retire and start traveling so is thinking he will do that. Pt states not wearing his CPAP has been wonderful.  Pt states his goal is 250 pounds but okay if he does not get there becase he feels so good anyway.   Estimated daily fluid intake:  50 oz Estimated daily protein intake: 60-80 g Supplements: Bariatric MV, Calcium, biotin Current average weekly physical activity: some light walking until his knee is done healing  24-Hr Dietary Recall: 2 servings a day of fresh fruit First Meal: 2 scrambled eggs, 2 piece of bacon or sausage and eggs or raisin bran or half BLT Snack: fruit Second Meal: salad or  chicken or pork + beans Snack: Deli [place] and ham slices or protein shake Third Meal: chicken or salmon + sometimes asparagus or salad Snack: sometimes ice cream Beverages: water + flavoring, coffee + sugar free creamer, 10 oz milk (sometimes with sugar free chocolate flavoring), low sugar lemonade  Post-Op Goals/ Signs/ Symptoms Using straws: No Drinking while eating: No Chewing/swallowing difficulties: No Changes in vision: No Changes to mood/headaches: No Hair loss/changes to skin/nails: some thinning Difficulty focusing/concentrating: No Sweating: No Limb weakness: No Dizziness/lightheadedness: No Palpitations: No  Carbonated/caffeinated beverages: 2 cup coffee each morning N/V/D/C/Gas: No Abdominal pain: No Dumping syndrome: No    NUTRITION DIAGNOSIS  Overweight/obesity ([place]-3.3) related to past poor dietary habits and physical inactivity as evidenced by completed bariatric surgery and following dietary guidelines for continued weight loss and healthy nutrition status.     NUTRITION INTERVENTION Nutrition counseling (C-1) and education (E-2) to facilitate bariatric surgery goals, including: The importance of consuming adequate calories as well as certain nutrients daily due to the body's need for essential vitamins, minerals, and fats The importance of daily physical activity and to reach a goal of at least  150 minutes of moderate to vigorous physical activity weekly (or as directed by their physician) due to benefits such as increased musculature and improved lab values The importance of intuitive eating specifically learning hunger-satiety cues and understanding the importance of learning a new body: The importance of mindful eating to avoid grazing behaviors  Eat non-starchy vegetables 2 times a day 7 days a week  Purpose of hydration: Water makes up over 50% of your total body water, and is part of many organs throughout the body. Water is essential to transport digested  nutrients, regulate body temperature, rid the body of waste products, and protects joints and the spinal cord. When not properly hydrated you will begin to experience headaches, cramps and dizziness. Further dehydration can result in rapid heart rate, shock, oliguria, and may cause seizures.  https://www.merckmanuals.com/home/hormonal-and-metabolic-disordehttps://www.usgs.gov/special-topic/water-science-school/science/water-you-water-and-human-body?qt-science_center_objects=0#qt-science_center_objectsrs/water-balance/about-body-water HistoricalGrowth.gl https://www.stevens.org/ PimpTShirt.fi https://www.health.InvestmentBrowse.at Why you need complex carbohydrates: Whole grains and other complex carbohydrates are required to have a healthy diet. Whole grains provide fiber which can help with blood glucose levels and help keep you satiated. Fruits and starchy vegetables provide essential vitamins and minerals required for immune function, eyesight support, brain support, bone density, wound healing and many other functions within the body. According to the current evidenced based 2020-2025 Dietary Guidelines for Americans, complex carbohydrates are part of a healthy eating pattern which is associated with a decreased risk for type 2 diabetes, cancers, and cardiovascular disease.    Handouts Provided Include  Phase 7 Food Plan  Learning Style & Readiness for Change Teaching method utilized: Visual & Auditory  Demonstrated degree of understanding via: Teach Back  Readiness Level: Change in Progress Barriers to learning/adherence to lifestyle change: None identified  RD's Notes for Next Visit Assess adherence to pt chosen goals   MONITORING & EVALUATION Dietary intake, weekly physical activity, body  weight  Next Steps Patient is to follow-up in October

## 2021-07-15 NOTE — Telephone Encounter (Signed)
Patient called needing Rx refilled Oxycodone. Patient said he contacted the pharmacy and they do not have a Rx request yet. The number to contact patient is [phone]

## 2021-07-15 NOTE — Telephone Encounter (Signed)
OK for refilll

## 2021-07-24 ENCOUNTER — Ambulatory Visit: Payer: BLUE CROSS/BLUE SHIELD | Admitting: Skilled Nursing Facility1

## 2021-10-15 ENCOUNTER — Encounter: Payer: BLUE CROSS/BLUE SHIELD | Admitting: Skilled Nursing Facility1

## 2021-10-15 ENCOUNTER — Encounter: Payer: BLUE CROSS/BLUE SHIELD | Attending: Surgery | Admitting: Skilled Nursing Facility1

## 2021-10-15 ENCOUNTER — Encounter: Payer: Self-pay | Admitting: Skilled Nursing Facility1

## 2021-10-15 DIAGNOSIS — E669 Obesity, unspecified: Secondary | ICD-10-CM | POA: Diagnosis present

## 2021-10-15 NOTE — Progress Notes (Signed)
Bariatric Nutrition Follow-Up Visit Medical Nutrition Therapy    Pt's Expectations of Surgery/ Goals: Weight Loss, Lower BMI to be eligible for knee surgery  Sx date: October 11th 2022  NUTRITION ASSESSMENT   Anthropometrics  Start weight at [facility]: 420 lbs (date: 06/15/2020) Today's weight: the body comp scale did not read him    Body Composition Scale 10/29/2020 12/17/2020 07/15/2021  Current Body Weight 376.7 343.3 276.3  Total Body Fat % 41 38.6 32.2  Visceral Fat 41 35 24  Fat-Free Mass % 58.9 61.3 67.7   Total Body Water % 39.9 42.3 48.7  Muscle-Mass lbs 71.2 64.8 52.1  BMI 48.6 44.2 35.5  Body Fat Displacement            Torso  lbs 96 82.4 55.2         Left Leg  lbs 19.2 16.4 11         Right Leg  lbs 19.2 16.4 11         Left Arm  lbs 9.6 8.2 5.5         Right Arm   lbs 9.6 8.2 5.5   Clinical  Medical hx: HTN, HLD, CAD Medications: Lisinopril-HCTZ, Zetia Labs:   Lifestyle & Dietary Hx   Pt states he has been walking every day and retired and will start his traveling starting with [place]!  Pt states this past year has been great and has learned what and how to eat.  Pt states he has some light weight but has yet to sue them but it is on his mind.    Estimated daily fluid intake:  50 oz Estimated daily protein intake: 80 g Supplements: Bariatric MV, Calcium, biotin Current average weekly physical activity: walking 7 days a week 1.5 miles  24-Hr Dietary Recall: 2 servings a day of fresh fruit First Meal: egg and bacon or 2 eggs or egg and toast or biscuit + tomato Snack: fruit Second Meal: leftover dinner or tortilla + cheese + pizza sauce + pepporoni or salmon Snack:  Third Meal: vegetable soup + chicken or burger or meatloaf + baked sweet potato Snack:  Beverages: water + flavoring, 1-2 coffee + sugar free creamer, 10+ oz milk (sometimes with sugar free chocolate flavoring), low sugar lemonade, tomato juice, fruit juice  Post-Op Goals/ Signs/  Symptoms Using straws: No Drinking while eating: No Chewing/swallowing difficulties: No Changes in vision: No Changes to mood/headaches: No Hair loss/changes to skin/nails: some thinning Difficulty focusing/concentrating: No Sweating: No Limb weakness: No Dizziness/lightheadedness: No Palpitations: No  Carbonated/caffeinated beverages: 2 cup coffee each morning N/V/D/C/Gas: No Abdominal pain: No Dumping syndrome: No    NUTRITION DIAGNOSIS  Overweight/obesity ([place]-3.3) related to past poor dietary habits and physical inactivity as evidenced by completed bariatric surgery and following dietary guidelines for continued weight loss and healthy nutrition status.     NUTRITION INTERVENTION Nutrition counseling (C-1) and education (E-2) to facilitate bariatric surgery goals, including: The importance of consuming adequate calories as well as certain nutrients daily due to the body's need for essential vitamins, minerals, and fats The importance of daily physical activity and to reach a goal of at least 150 minutes of moderate to vigorous physical activity weekly (or as directed by their physician) due to benefits such as increased musculature and improved lab values The importance of intuitive eating specifically learning hunger-satiety cues and understanding the importance of learning a new body: The importance of mindful eating to avoid grazing behaviors  Eat non-starchy vegetables 2 times  a day 7 days a week  Purpose of hydration: Water makes up over 50% of your total body water, and is part of many organs throughout the body. Water is essential to transport digested nutrients, regulate body temperature, rid the body of waste products, and protects joints and the spinal cord. When not properly hydrated you will begin to experience headaches, cramps and dizziness. Further dehydration can result in rapid heart rate, shock, oliguria, and may cause seizures.   https://www.merckmanuals.com/home/hormonal-and-metabolic-disordehttps://www.usgs.gov/special-topic/water-science-school/science/water-you-water-and-human-body?qt-science_center_objects=0#qt-science_center_objectsrs/water-balance/about-body-water HistoricalGrowth.gl https://www.stevens.org/ PimpTShirt.fi https://www.health.InvestmentBrowse.at Why you need complex carbohydrates: Whole grains and other complex carbohydrates are required to have a healthy diet. Whole grains provide fiber which can help with blood glucose levels and help keep you satiated. Fruits and starchy vegetables provide essential vitamins and minerals required for immune function, eyesight support, brain support, bone density, wound healing and many other functions within the body. According to the current evidenced based 2020-2025 Dietary Guidelines for Americans, complex carbohydrates are part of a healthy eating pattern which is associated with a decreased risk for type 2 diabetes, cancers, and cardiovascular disease.    Handouts Previously Provided Include  Phase 7 Food Plan  Learning Style & Readiness for Change Teaching method utilized: Visual & Auditory  Demonstrated degree of understanding via: Teach Back  Readiness Level: Change in Progress Barriers to learning/adherence to lifestyle change: None identified  RD's Notes for Next Visit Assess adherence to pt chosen goals   MONITORING & EVALUATION Dietary intake, weekly physical activity, body weight  Next Steps Patient is to follow-up as needed

## 2021-11-06 ENCOUNTER — Telehealth: Payer: Self-pay | Admitting: *Deleted

## 2021-11-06 NOTE — Telephone Encounter (Signed)
[employer] sent over forms for Dr. Johney Frame to review, sign, and include/send records on this pt via onbase.  [employer] is life insurance Co of [place].   They faxed this to onbase for Dr. Johney Frame to advise on, for they are reviewing the long-term disability claim for the pt.    Called Andrea at [facility] and endorsed to her that per Dr. Johney Frame, pt does not have a cardiac reason for disability, and she recommends that they discuss this with the pts PCP to assist in LTD claim request.  Seth Bake is aware that Dr. Johney Frame will not be providing request from a cardiac perspective and we will not be releasing any cardiac information to their establishment.   Seth Bake stated she will have our facility taken off the account and will reach out to his other Providers for further assistance and advisement on LTD claim.   Seth Bake verbalized understanding and agrees with this plan.  Will give this paperwork back to our front office team for further completion and follow-up with the pt.  Pt will not need to pay a fee, for Dr. Johney Frame will not be providing him LTD from a cardiac perspective.

## 2021-11-21 ENCOUNTER — Telehealth: Payer: Self-pay | Admitting: Orthopedic Surgery

## 2021-11-21 NOTE — Telephone Encounter (Signed)
Faxed forms back to [employer] advising "Unable to complete forms, patient last seen 06/03/2021,needs appt"

## 2021-11-21 NOTE — Telephone Encounter (Signed)
Patient last seen 06/03/21. Long term disability forms received. Please advise.

## 2021-11-21 NOTE — Telephone Encounter (Signed)
Would need office visit next available opening.

## 2021-12-06 NOTE — Progress Notes (Unsigned)
Cardiology Office Note:    Date:  12/06/2021   ID:  Nathan Pope, DOB 1960-02-10, MRN [id number]  PCP:  Caryl Bis, MD   [facility] Providers Cardiologist:  None {  Referring MD: Caryl Bis, MD    History of Present Illness:    Nathan Pope is a 61 y.o. male with a hx of CAD with history of PCI in LAD, HTN, HLD, and obesity who presents to clinic for follow-up.  Was last seen in clinic on 12/2020 where he was doing very well. Had lost 60lbs following bariatric surgery.   Today, the patient states he overall feels well. Has lost 144lbs since his bariatric surgery. Had his right knee replaced and is working with PT currently. Otherwise he is doing well. No chest pain, SOB, lightheadedness, or dizziness. Has some swelling in the right knee but otherwise no significant edema. Tolerating medications without issue.   Past Medical History:  Diagnosis Date   Anxiety    Arthritis    Cancer ([place])    melanoma removed (mole on stomach)   Coronary artery disease    Hypercholesterolemia    Hypertension    Obesity    Osteoarthritis    PONV (postoperative nausea and vomiting)    Sleep apnea     Past Surgical History:  Procedure Laterality Date   BARIATRIC SURGERY     BIOPSY  08/09/2020   Procedure: BIOPSY;  Surgeon: Felicie Morn, MD;  Location: [facility];  Service: General;;   CARDIAC SURGERY  2005   stent   ESOPHAGOGASTRODUODENOSCOPY N/A 08/09/2020   Procedure: ESOPHAGOGASTRODUODENOSCOPY (EGD);  Surgeon: Felicie Morn, MD;  Location: [facility];  Service: General;  Laterality: N/A;   TOTAL KNEE ARTHROPLASTY Right 04/25/2021   Procedure: RIGHT TOTAL KNEE ARTHROPLASTY;  Surgeon: Newt Minion, MD;  Location: [place];  Service: Orthopedics;  Laterality: Right;   UPPER GI ENDOSCOPY N/A 10/15/2020   Procedure: UPPER GI ENDOSCOPY;  Surgeon: Felicie Morn, MD;  Location: [facility];  Service: General;  Laterality: N/A;    Current Medications: No  outpatient medications have been marked as taking for the 12/10/21 encounter (Appointment) with Freada Bergeron, MD.     Allergies:   Patient has no known allergies.   Social History   Socioeconomic History   Marital status: Married    Spouse name: Not on file   Number of children: 3   Years of education: Not on file   Highest education level: Not on file  Occupational History   Not on file  Tobacco Use   Smoking status: Never   Smokeless tobacco: Never  Vaping Use   Vaping Use: Never used  Substance and Sexual Activity   Alcohol use: Not Currently   Drug use: No   Sexual activity: Not on file  Other Topics Concern   Not on file  Social History Narrative   Not on file   Social Determinants of Health   Financial Resource Strain: Not on file  Food Insecurity: Not on file  Transportation Needs: Not on file  Physical Activity: Not on file  Stress: Not on file  Social Connections: Not on file     Family History: The patient's family history includes Arthritis in his father; Diabetes in his father; Hypercalcemia in his maternal grandfather; Hypertension in his father.  ROS:   Please see the history of present illness.    Review of Systems  Constitutional:  Positive for weight loss. Negative for  chills and fever.  HENT:  Negative for sore throat.   Eyes:  Negative for blurred vision.  Respiratory:  Negative for shortness of breath.   Cardiovascular:  Negative for chest pain, palpitations, orthopnea, claudication, leg swelling and PND.  Gastrointestinal:  Negative for nausea and vomiting.  Genitourinary:  Negative for hematuria.  Musculoskeletal:  Positive for joint pain.  Neurological:  Negative for dizziness and loss of consciousness.    EKGs/Labs/Other Studies Reviewed:    The following studies were reviewed today: CTA 12-29-20: FINDINGS: Cardiovascular: Heart is normal size. Scattered coronary artery and aortic calcifications. No evidence of aortic  aneurysm. Maximum aortic diameter in the ascending thoracic aorta measures 3.7 cm. No dissection.   Mediastinum/Nodes: No mediastinal, hilar, or axillary adenopathy. Trachea and esophagus are unremarkable. Thyroid unremarkable.   Lungs/Pleura: Lungs are clear. No focal airspace opacities or suspicious nodules. No effusions.   Upper Abdomen: Imaging into the upper abdomen demonstrates no acute findings.   Musculoskeletal: Chest wall soft tissues are unremarkable. No acute bony abnormality.   Review of the MIP images confirms the above findings.   IMPRESSION: No evidence of thoracic aortic aneurysm. Maximum diameter 3.7 cm in the ascending thoracic aorta.   Coronary artery disease.  TTE 07/09/20: IMPRESSIONS   1. Left ventricular ejection fraction, by estimation, is 60 to 65%. The  left ventricle has normal function. The left ventricle has no regional  wall motion abnormalities. The left ventricular internal cavity size was  mildly dilated. There is mild  concentric left ventricular hypertrophy. Left ventricular diastolic  parameters are consistent with Grade I diastolic dysfunction (impaired  relaxation).   2. Right ventricular systolic function is normal. The right ventricular  size is normal.   3. The mitral valve is normal in structure. No evidence of mitral valve  regurgitation. No evidence of mitral stenosis.   4. The aortic valve is normal in structure. Aortic valve regurgitation is  not visualized. No aortic stenosis is present.   5. Aortic dilatation noted. There is borderline dilatation of the aortic  root, measuring 39 mm. There is borderline dilatation of the ascending  aorta, measuring 39 mm.   6. The inferior vena cava is normal in size with greater than 50%  respiratory variability, suggesting right atrial pressure of 3 mmHg.   7. Trivial pericardial effusion is present. The pericardial effusion is  circumferential.   EKG:  EKG 06/11/21 with sinus  bradycardia with HR 59. Low voltage.  Recent Labs: 04/16/2021: BUN 14; Creatinine, Ser 1.03; Hemoglobin 13.8; Platelets 221; Potassium 4.5; Sodium 140  Recent Lipid Panel    Component Value Date/Time   CHOL 106 12/11/2020 0944   TRIG 103 12/11/2020 0944   HDL 39 (L) 12/11/2020 0944   CHOLHDL 2.7 12/11/2020 0944   LDLCALC 48 12/11/2020 0944      Physical Exam:    VS:  There were no vitals taken for this visit.    Wt Readings from Last 3 Encounters:  07/15/21 276 lb 4.8 oz (125.3 kg)  06/11/21 282 lb 9.6 oz (128.2 kg)  04/25/21 295 lb (133.8 kg)     GEN:  Comfortable, well appearing HEENT: Normal NECK: No JVD; No carotid bruits CARDIAC: RRR, no murmurs, rubs, gallops RESPIRATORY:  CTAB ABDOMEN: Soft, nontender, nondistended MUSCULOSKELETAL:  No edema; No deformity  SKIN: Warm and dry NEUROLOGIC:  Alert and oriented x 3 PSYCHIATRIC:  Normal affect   ASSESSMENT:    No diagnosis found.  PLAN:    In order of  problems listed above:  #CAD s/p LAD PCI in 2005: Doing well with no anginal or HF symptoms. Compliant with all medications. LVEF 60-65% on TTE 07/09/20. -Continue ASA '81mg'$  daily -Continue lipitor '80mg'$  daily -Continue lisinopril '20mg'$  daily -Continue zetia '10mg'$  daily  #HTN: Well controlled and at goal <120/80s. -Continue dilt '240mg'$  daily -Continue lisinopril-HCTZ 20-12.'5mg'$  daily -Will need to monitor BP with weight loss as may need less medication  #HLD: LDL 59. Goal <70. -Continue lipitor '80mg'$  daily -Continue zetia '10mg'$  daily -Will repeat labs with PCP  #History of Morbid Obesity with BMI 53 s/p robotic sleeve gastrectomy with BMI 36: S/p bariatric surgery with >144lb weight loss!! BMI now 36. Continuing to work on lifestyle modifications  Medication Adjustments/Labs and Tests Ordered: Current medicines are reviewed at length with the patient today.  Concerns regarding medicines are outlined above.  No orders of the defined types were placed in this  encounter.  No orders of the defined types were placed in this encounter.   There are no Patient Instructions on file for this visit.     Signed,  Gwyndolyn Kaufman, MD 12/06/2021 1:54 PM    [place]

## 2021-12-10 ENCOUNTER — Encounter: Payer: Self-pay | Admitting: Cardiology

## 2021-12-10 ENCOUNTER — Ambulatory Visit: Payer: BLUE CROSS/BLUE SHIELD | Attending: Cardiology | Admitting: Cardiology

## 2021-12-10 VITALS — BP 114/72 | HR 56 | Ht 74.0 in | Wt 281.8 lb

## 2021-12-10 DIAGNOSIS — E785 Hyperlipidemia, unspecified: Secondary | ICD-10-CM

## 2021-12-10 DIAGNOSIS — I25119 Atherosclerotic heart disease of native coronary artery with unspecified angina pectoris: Secondary | ICD-10-CM | POA: Diagnosis not present

## 2021-12-10 DIAGNOSIS — I77819 Aortic ectasia, unspecified site: Secondary | ICD-10-CM

## 2021-12-10 DIAGNOSIS — I10 Essential (primary) hypertension: Secondary | ICD-10-CM

## 2021-12-10 DIAGNOSIS — E669 Obesity, unspecified: Secondary | ICD-10-CM

## 2021-12-10 NOTE — Progress Notes (Signed)
Cardiology Office Note:    Date:  12/10/2021   ID:  Nathan Pope, DOB 14-Aug-1960, MRN [id number]  PCP:  Nathan Bis, MD   [facility] Providers Cardiologist:  None {  Referring MD: Nathan Bis, MD    History of Present Illness:    Nathan Pope is a 61 y.o. male with a hx of CAD with history of PCI in LAD, HTN, HLD, and obesity who presents to clinic for follow-up.  Was last seen in clinic on 06/2021 where he was doing very well. Had lost significant amount of weight prior to and after bariatric surgery.  Today, he says he has been good.  He mentions that occasionally, maybe once a month, he feels dizzy, but believes this is due to dehydration.   His at home blood pressures are usually in the 120s/60s range.    He states that in total, he has lost around 155 pounds during his weight loss journey.     He walks about 1 mile every morning. He feels great while walking.   He denies any palpitations, chest pain, shortness of breath, or peripheral edema. No headaches, syncope, orthopnea, or PND.  Past Medical History:  Diagnosis Date   Anxiety    Arthritis    Cancer ([place])    melanoma removed (mole on stomach)   Coronary artery disease    Hypercholesterolemia    Hypertension    Obesity    Osteoarthritis    PONV (postoperative nausea and vomiting)    Sleep apnea     Past Surgical History:  Procedure Laterality Date   BARIATRIC SURGERY     BIOPSY  08/09/2020   Procedure: BIOPSY;  Surgeon: Felicie Morn, MD;  Location: [facility];  Service: General;;   CARDIAC SURGERY  2005   stent   ESOPHAGOGASTRODUODENOSCOPY N/A 08/09/2020   Procedure: ESOPHAGOGASTRODUODENOSCOPY (EGD);  Surgeon: Felicie Morn, MD;  Location: [facility];  Service: General;  Laterality: N/A;   TOTAL KNEE ARTHROPLASTY Right 04/25/2021   Procedure: RIGHT TOTAL KNEE ARTHROPLASTY;  Surgeon: Newt Minion, MD;  Location: [place];  Service: Orthopedics;  Laterality: Right;    UPPER GI ENDOSCOPY N/A 10/15/2020   Procedure: UPPER GI ENDOSCOPY;  Surgeon: Felicie Morn, MD;  Location: [facility];  Service: General;  Laterality: N/A;    Current Medications: Current Meds  Medication Sig   acetaminophen (TYLENOL) 500 MG tablet Take 1,000 mg by mouth every 6 (six) hours as needed for headache.   aspirin EC 81 MG tablet Take 81 mg by mouth in the morning.   atorvastatin (LIPITOR) 80 MG tablet Take 80 mg by mouth in the morning.   BIOTIN PO Take 1 tablet by mouth in the morning.   CARTIA XT 240 MG 24 hr capsule Take 240 mg by mouth daily.   doxycycline (VIBRAMYCIN) 50 MG capsule Take 50 mg by mouth daily as needed (rosacea flare ups).   ezetimibe (ZETIA) 10 MG tablet Take 1 tablet (10 mg total) by mouth daily.   lisinopril-hydrochlorothiazide (ZESTORETIC) 20-12.5 MG tablet Take 1 tablet by mouth daily.   Multiple Vitamin (MULTI-VITAMIN) tablet Take 1 tablet by mouth daily.     Allergies:   Patient has no known allergies.   Social History   Socioeconomic History   Marital status: Married    Spouse name: Not on file   Number of children: 3   Years of education: Not on file   Highest education level: Not on file  Occupational History   Not on file  Tobacco Use   Smoking status: Never   Smokeless tobacco: Never  Vaping Use   Vaping Use: Never used  Substance and Sexual Activity   Alcohol use: Not Currently   Drug use: No   Sexual activity: Not on file  Other Topics Concern   Not on file  Social History Narrative   Not on file   Social Determinants of Health   Financial Resource Strain: Not on file  Food Insecurity: Not on file  Transportation Needs: Not on file  Physical Activity: Not on file  Stress: Not on file  Social Connections: Not on file     Family History: The patient's family history includes Arthritis in his father; Diabetes in his father; Hypercalcemia in his maternal grandfather; Hypertension in his father.  ROS:   Please see  the history of present illness.    Review of Systems  Constitutional:  Positive for weight loss. Negative for chills and fever.  HENT:  Negative for sore throat.   Eyes:  Negative for blurred vision.  Respiratory:  Negative for shortness of breath.   Cardiovascular:  Negative for chest pain, palpitations, orthopnea, claudication, leg swelling and PND.  Gastrointestinal:  Negative for nausea and vomiting.  Genitourinary:  Negative for hematuria.  Musculoskeletal:  Positive for joint pain.  Neurological:  Negative for dizziness and loss of consciousness.    EKGs/Labs/Other Studies Reviewed:    The following studies were reviewed today:  CTA 12/2020: FINDINGS: Cardiovascular: Heart is normal size. Scattered coronary artery and aortic calcifications. No evidence of aortic aneurysm. Maximum aortic diameter in the ascending thoracic aorta measures 3.7 cm. No dissection.   Mediastinum/Nodes: No mediastinal, hilar, or axillary adenopathy. Trachea and esophagus are unremarkable. Thyroid unremarkable.   Lungs/Pleura: Lungs are clear. No focal airspace opacities or suspicious nodules. No effusions.   Upper Abdomen: Imaging into the upper abdomen demonstrates no acute findings.   Musculoskeletal: Chest wall soft tissues are unremarkable. No acute bony abnormality.   Review of the MIP images confirms the above findings.   IMPRESSION: No evidence of thoracic aortic aneurysm. Maximum diameter 3.7 cm in the ascending thoracic aorta.   Coronary artery disease.  TTE 07/09/20: IMPRESSIONS   1. Left ventricular ejection fraction, by estimation, is 60 to 65%. The  left ventricle has normal function. The left ventricle has no regional  wall motion abnormalities. The left ventricular internal cavity size was  mildly dilated. There is mild  concentric left ventricular hypertrophy. Left ventricular diastolic  parameters are consistent with Grade I diastolic dysfunction (impaired   relaxation).   2. Right ventricular systolic function is normal. The right ventricular  size is normal.   3. The mitral valve is normal in structure. No evidence of mitral valve  regurgitation. No evidence of mitral stenosis.   4. The aortic valve is normal in structure. Aortic valve regurgitation is  not visualized. No aortic stenosis is present.   5. Aortic dilatation noted. There is borderline dilatation of the aortic  root, measuring 39 mm. There is borderline dilatation of the ascending  aorta, measuring 39 mm.   6. The inferior vena cava is normal in size with greater than 50%  respiratory variability, suggesting right atrial pressure of 3 mmHg.   7. Trivial pericardial effusion is present. The pericardial effusion is  circumferential.   EKG:  EKG is personally reviewed. 12/10/21: EKG was not ordered. 06/11/21: sinus bradycardia with HR 59. Low voltage.  Recent Labs:  04/16/2021: BUN 14; Creatinine, Ser 1.03; Hemoglobin 13.8; Platelets 221; Potassium 4.5; Sodium 140  Recent Lipid Panel    Component Value Date/Time   CHOL 106 12/11/2020 0944   TRIG 103 12/11/2020 0944   HDL 39 (L) 12/11/2020 0944   CHOLHDL 2.7 12/11/2020 0944   LDLCALC 48 12/11/2020 0944      Physical Exam:    VS:  BP 114/72   Pulse (!) 56   Ht '6\' 2"'$  (1.88 m)   Wt 281 lb 12.8 oz (127.8 kg)   SpO2 97%   BMI 36.18 kg/m     Wt Readings from Last 3 Encounters:  12/10/21 281 lb 12.8 oz (127.8 kg)  07/15/21 276 lb 4.8 oz (125.3 kg)  06/11/21 282 lb 9.6 oz (128.2 kg)     GEN:  Comfortable, well appearing HEENT: Normal NECK: No JVD; No carotid bruits CARDIAC: RRR, no murmurs, rubs, gallops RESPIRATORY:  Clear bilaterally ABDOMEN: Soft, nontender, nondistended MUSCULOSKELETAL:  No edema; No deformity  SKIN: Warm and dry NEUROLOGIC:  Alert and oriented x 3 PSYCHIATRIC:  Normal affect   ASSESSMENT:    1. Coronary artery disease involving native coronary artery of native heart with angina  pectoris ([place])   2. Hyperlipidemia, unspecified hyperlipidemia type   3. Primary hypertension   4. Obesity (BMI 30-39.9)   5. Aortic dilatation (H[facility])    PLAN:    In order of problems listed above:  #CAD s/p LAD PCI in 2005: Doing well with no anginal or HF symptoms. Compliant with all medications. LVEF 60-65% on TTE 07/09/20. -Continue ASA '81mg'$  daily -Continue lipitor '80mg'$  daily -Continue lisinopril '20mg'$  daily -Continue zetia '10mg'$  daily  #HTN: Well controlled and at goal <120/80s. -Continue dilt '240mg'$  daily -Continue lisinopril-HCTZ 20-12.'5mg'$  daily -Will need to monitor BP with weight loss as may need less medication  #HLD: LDL 48. Goal <70. -Continue lipitor '80mg'$  daily -Continue zetia '10mg'$  daily -Will repeat labs with PCP  #History of Morbid Obesity with BMI 53 s/p robotic sleeve gastrectomy with BMI 36: S/p bariatric surgery with >150lb weight loss!! BMI now 36. Continuing to work on lifestyle modifications  #Mild dilation of Aorta: Measures 25m on TTE 07/2020.  -Repeat TTE for monitoring in 6 months  Medication Adjustments/Labs and Tests Ordered: Current medicines are reviewed at length with the patient today.  Concerns regarding medicines are outlined above.  Orders Placed This Encounter  Procedures   ECHOCARDIOGRAM COMPLETE   No orders of the defined types were placed in this encounter.   Patient Instructions  Medication Instructions:   Your physician recommends that you continue on your current medications as directed. Please refer to the Current Medication list given to you today.  *If you need a refill on your cardiac medications before your next appointment, please call your pharmacy*    Testing/Procedures:  Your physician has requested that you have an echocardiogram. Echocardiography is a painless test that uses sound waves to create images of your heart. It provides your doctor with information about the size and shape of your heart and how well  your heart's chambers and valves are working. This procedure takes approximately one hour. There are no restrictions for this procedure. SCHEDULE ECHO TO BE DONE IN 6 MONTHS PER DR. PJohney Frame  Please do NOT wear cologne, perfume, aftershave, or lotions (deodorant is allowed). Please arrive 15 minutes prior to your appointment time.    Follow-Up: [facility].  As part of  our continuing mission to provide you with exceptional heart care, we have created designated Provider Care Teams.  These Care Teams include your primary Cardiologist (physician) and Advanced Practice Providers (APPs -  Physician Assistants and Nurse Practitioners) who all work together to provide you with the care you need, when you need it.  We recommend signing up for the patient portal called "MyChart".  Sign up information is provided on this After Visit Summary.  MyChart is used to connect with patients for Virtual Visits (Telemedicine).  Patients are able to view lab/test results, encounter notes, upcoming appointments, etc.  Non-urgent messages can be sent to your provider as well.   To learn more about what you can do with MyChart, go to [email or web address].    Your next appointment:   6 month(s)  The format for your next appointment:   In Person  Provider:   DR. Johney Frame   Important Information About Sugar          I,Breanna Adamick,acting as a scribe for Freada Bergeron, MD.,have documented all relevant documentation on the behalf of Freada Bergeron, MD,as directed by  Freada Bergeron, MD while in the presence of Freada Bergeron, MD.  I, Freada Bergeron, MD, have reviewed all documentation for this visit. The documentation on 12/10/21 for the exam, diagnosis, procedures, and orders are all accurate and complete.   Signed,  Gwyndolyn Kaufman, MD 12/10/2021 9:27 AM    [place]

## 2021-12-10 NOTE — Patient Instructions (Signed)
Medication Instructions:   Your physician recommends that you continue on your current medications as directed. Please refer to the Current Medication list given to you today.  *If you need a refill on your cardiac medications before your next appointment, please call your pharmacy*    Testing/Procedures:  Your physician has requested that you have an echocardiogram. Echocardiography is a painless test that uses sound waves to create images of your heart. It provides your doctor with information about the size and shape of your heart and how well your heart's chambers and valves are working. This procedure takes approximately one hour. There are no restrictions for this procedure. SCHEDULE ECHO TO BE DONE IN 6 MONTHS PER DR. Johney Frame   Please do NOT wear cologne, perfume, aftershave, or lotions (deodorant is allowed). Please arrive 15 minutes prior to your appointment time.    Follow-Up: At [facility], you and your health needs are our priority.  As part of our continuing mission to provide you with exceptional heart care, we have created designated Provider Care Teams.  These Care Teams include your primary Cardiologist (physician) and Advanced Practice Providers (APPs -  Physician Assistants and Nurse Practitioners) who all work together to provide you with the care you need, when you need it.  We recommend signing up for the patient portal called "MyChart".  Sign up information is provided on this After Visit Summary.  MyChart is used to connect with patients for Virtual Visits (Telemedicine).  Patients are able to view lab/test results, encounter notes, upcoming appointments, etc.  Non-urgent messages can be sent to your provider as well.   To learn more about what you can do with MyChart, go to [email or web address].    Your next appointment:   6 month(s)  The format for your next appointment:   In Person  Provider:   DR. Johney Frame   Important Information About  Sugar

## 2022-01-13 ENCOUNTER — Encounter: Payer: Self-pay | Admitting: Family

## 2022-01-13 ENCOUNTER — Ambulatory Visit: Payer: Self-pay

## 2022-01-13 ENCOUNTER — Ambulatory Visit: Payer: BLUE CROSS/BLUE SHIELD | Admitting: Family

## 2022-01-13 DIAGNOSIS — M25561 Pain in right knee: Secondary | ICD-10-CM

## 2022-01-13 DIAGNOSIS — M1712 Unilateral primary osteoarthritis, left knee: Secondary | ICD-10-CM

## 2022-01-13 NOTE — Progress Notes (Addendum)
Post-Op Visit Note   Patient: Nathan Pope           Date of Birth: [birth year]           MRN: [id number] Visit Date: 01/13/2022 PCP: Caryl Bis, MD  Chief Complaint:  Chief Complaint  Patient presents with   Left Knee - Pain   Right Knee - Pain    HPI:  HPI The patient is 62 year old gentleman seen status post right total knee arthroplasty April of last year.  He has been experiencing some popping sounds in the right knee when he straightens or laterally towards the right knee.  States this primarily occurs when he is lying in bed.  He denies any instability or pain associated with this just wants to make sure that the popping is okay. Also seen for left knee pain which has been gradually worsening since May of last year.  States he cannot stand or walk for greater than an hour without stopping to rest which does relieve his pain.  Feels that the left knee pain and stiffness is interfering with his lifestyle.  Unable to perform the activities he would like to do.  He has known bone-on-bone contact osteoarthritis of the left knee he would like to plan to have his left knee replaced sometime this year.  Has had Depo-Medrol injections in the past without relief Right Knee Exam   Muscle Strength  The patient has normal right knee strength.  Tenderness  The patient is experiencing no tenderness.   Range of Motion  The patient has normal right knee ROM.  Tests  Varus: negative Valgus: negative   Left Knee Exam   Muscle Strength  The patient has normal left knee strength.  Tenderness  The patient is experiencing tenderness in the medial joint line and lateral joint line.  Other  Erythema: absent Swelling: mild       Visit Diagnoses:  1. Primary osteoarthritis of left knee   2. Right knee pain, unspecified chronicity     Plan: Reassurance of the right knee provided today.  We will go ahead and begin authorizing the left total knee arthroplasty he will call  us when he is ready to set this up.  Follow-Up Instructions: No follow-ups on file.   Imaging: No results found.  Orders:  Orders Placed This Encounter  Procedures   XR Knee 1-2 Views Right   XR Knee 1-2 Views Left   No orders of the defined types were placed in this encounter.    PMFS History: Patient Active Problem List   Diagnosis Date Noted   Total knee replacement status, right 04/25/2021   Unilateral primary osteoarthritis, right knee    Morbid obesity with BMI of 50.0-59.9, adult ([place]) 10/15/2020   HYPERLIPIDEMIA 09/05/2008   HYPERTENSION 09/05/2008   CAD 09/05/2008   IRREGULAR HEART RATE 09/05/2008   Past Medical History:  Diagnosis Date   Anxiety    Arthritis    Cancer ([place])    melanoma removed (mole on stomach)   Coronary artery disease    Hypercholesterolemia    Hypertension    Obesity    Osteoarthritis    PONV (postoperative nausea and vomiting)    Sleep apnea     Family History  Problem Relation Age of Onset   Diabetes Father    Arthritis Father    Hypertension Father    Hypercalcemia Maternal Grandfather     Past Surgical History:  Procedure Laterality Date   BARIATRIC SURGERY  BIOPSY  08/09/2020   Procedure: BIOPSY;  Surgeon: Felicie Morn, MD;  Location: [facility];  Service: General;;   CARDIAC SURGERY  2005   stent   ESOPHAGOGASTRODUODENOSCOPY N/A 08/09/2020   Procedure: ESOPHAGOGASTRODUODENOSCOPY (EGD);  Surgeon: Felicie Morn, MD;  Location: [facility];  Service: General;  Laterality: N/A;   TOTAL KNEE ARTHROPLASTY Right 04/25/2021   Procedure: RIGHT TOTAL KNEE ARTHROPLASTY;  Surgeon: Newt Minion, MD;  Location: [place];  Service: Orthopedics;  Laterality: Right;   UPPER GI ENDOSCOPY N/A 10/15/2020   Procedure: UPPER GI ENDOSCOPY;  Surgeon: Felicie Morn, MD;  Location: [facility];  Service: General;  Laterality: N/A;   Social History   Occupational History   Not on file  Tobacco Use   Smoking status:  Never   Smokeless tobacco: Never  Vaping Use   Vaping Use: Never used  Substance and Sexual Activity   Alcohol use: Not Currently   Drug use: No   Sexual activity: Not on file

## 2022-01-19 ENCOUNTER — Telehealth: Payer: Self-pay | Admitting: Family

## 2022-01-19 NOTE — Telephone Encounter (Signed)
Patient needs work note stating the condition of knee and restrictions as far as taking the stairs,standing periods, and walking for periods of time at least until he gets knee replacement

## 2022-01-19 NOTE — Telephone Encounter (Signed)
What restrictions do you have for this pt?

## 2022-01-20 ENCOUNTER — Other Ambulatory Visit: Payer: Self-pay | Admitting: Family

## 2022-01-20 NOTE — Telephone Encounter (Signed)
[name], note is for [name] to complete his [employer] forms. He stated he has paid form fee and signed auth. I printed note and gave to [name].

## 2022-01-20 NOTE — Telephone Encounter (Signed)
Printed a letter

## 2022-06-15 ENCOUNTER — Ambulatory Visit (HOSPITAL_COMMUNITY): Payer: BLUE CROSS/BLUE SHIELD | Attending: Internal Medicine

## 2022-06-15 DIAGNOSIS — I10 Essential (primary) hypertension: Secondary | ICD-10-CM | POA: Insufficient documentation

## 2022-06-15 DIAGNOSIS — I77819 Aortic ectasia, unspecified site: Secondary | ICD-10-CM | POA: Diagnosis not present

## 2022-06-15 DIAGNOSIS — E669 Obesity, unspecified: Secondary | ICD-10-CM | POA: Insufficient documentation

## 2022-06-15 DIAGNOSIS — I25119 Atherosclerotic heart disease of native coronary artery with unspecified angina pectoris: Secondary | ICD-10-CM | POA: Insufficient documentation

## 2022-06-15 DIAGNOSIS — E785 Hyperlipidemia, unspecified: Secondary | ICD-10-CM | POA: Insufficient documentation

## 2022-06-15 LAB — ECHOCARDIOGRAM COMPLETE
Area-P 1/2: 3.97 cm2
S' Lateral: 3.5 cm

## 2022-06-18 NOTE — Progress Notes (Signed)
Cardiology Office Note:    Date:  06/22/2022   ID:  Nathan Pope, DOB 20-Feb-1960, MRN [id number]  PCP:  Richardean Chimera, MD   [facility] Providers Cardiologist:  None {  Referring MD: Richardean Chimera, MD    History of Present Illness:    Nathan Pope is a 62 y.o. male with a hx of CAD with history of PCI in LAD, HTN, HLD, and obesity who presents to clinic for follow-up.  Was last seen in clinic on 12/2021 wher he had lost over 155lbs after bariatric surgery. Was doing very well at that time.  Today, the patient Pope feels well. No chest pain, SOB, orthopnea or PND. Walks 2-2.5 miles per day. Completed a 5K in 04/2022 without exertional symptoms. Blood pressure well controlled. Tolerating medications as prescribed. Working on [employer], [employer]/film/video.  Past Medical History:  Diagnosis Date   Anxiety    Arthritis    Cancer (HCC)    melanoma removed (mole on stomach)   Coronary artery disease    Hypercholesterolemia    Hypertension    Obesity    Osteoarthritis    PONV (postoperative nausea and vomiting)    Sleep apnea     Past Surgical History:  Procedure Laterality Date   BARIATRIC SURGERY     BIOPSY  08/09/2020   Procedure: BIOPSY;  Surgeon: Quentin Ore, MD;  Location: [facility];  Service: General;;   CARDIAC SURGERY  2005   stent   ESOPHAGOGASTRODUODENOSCOPY N/A 08/09/2020   Procedure: ESOPHAGOGASTRODUODENOSCOPY (EGD);  Surgeon: Quentin Ore, MD;  Location: [facility];  Service: General;  Laterality: N/A;   TOTAL KNEE ARTHROPLASTY Right 04/25/2021   Procedure: RIGHT TOTAL KNEE ARTHROPLASTY;  Surgeon: Nadara Mustard, MD;  Location: [facility] OR;  Service: Orthopedics;  Laterality: Right;   UPPER GI ENDOSCOPY N/A 10/15/2020   Procedure: UPPER GI ENDOSCOPY;  Surgeon: Quentin Ore, MD;  Location: [facility];  Service: General;  Laterality: N/A;    Current Medications: Current Meds  Medication Sig   acetaminophen (TYLENOL) 500 MG tablet Take  1,000 mg by mouth every 6 (six) hours as needed for headache.   aspirin EC 81 MG tablet Take 81 mg by mouth in the morning.   atorvastatin (LIPITOR) 80 MG tablet Take 80 mg by mouth in the morning.   CARTIA XT 240 MG 24 hr capsule Take 240 mg by mouth daily.   doxycycline (VIBRAMYCIN) 50 MG capsule Take 50 mg by mouth daily as needed (rosacea flare ups).   ezetimibe (ZETIA) 10 MG tablet Take 1 tablet (10 mg total) by mouth daily.   lisinopril-hydrochlorothiazide (ZESTORETIC) 20-12.5 MG tablet Take 1 tablet by mouth daily.   Multiple Vitamin (MULTI-VITAMIN) tablet Take 1 tablet by mouth daily.     Allergies:   Patient has no known allergies.   Social History   Socioeconomic History   Marital status: Married    Spouse name: Not on file   Number of children: 3   Years of education: Not on file   Highest education level: Not on file  Occupational History   Not on file  Tobacco Use   Smoking status: Never   Smokeless tobacco: Never  Vaping Use   Vaping Use: Never used  Substance and Sexual Activity   Alcohol use: Not Currently   Drug use: No   Sexual activity: Not on file  Other Topics Concern   Not on file  Social History Narrative   Not on file  Social Determinants of Health   Financial Resource Strain: Not on file  Food Insecurity: Not on file  Transportation Needs: Not on file  Physical Activity: Not on file  Stress: Not on file  Social Connections: Not on file     Family History: The patient's family history includes Arthritis in his father; Diabetes in his father; Hypercalcemia in his maternal grandfather; Hypertension in his father.  ROS:   Please see the history of present illness.    As per HPI  EKGs/Labs/Other Studies Reviewed:    The following studies were reviewed today:  CTA 12/2020: FINDINGS: Cardiovascular: Heart is normal size. Scattered coronary artery and aortic calcifications. No evidence of aortic aneurysm. Maximum aortic diameter in the  ascending thoracic aorta measures 3.7 cm. No dissection.   Mediastinum/Nodes: No mediastinal, hilar, or axillary adenopathy. Trachea and esophagus are unremarkable. Thyroid unremarkable.   Lungs/Pleura: Lungs are clear. No focal airspace opacities or suspicious nodules. No effusions.   Upper Abdomen: Imaging into the upper abdomen demonstrates no acute findings.   Musculoskeletal: Chest wall soft tissues are unremarkable. No acute bony abnormality.   Review of the MIP images confirms the above findings.   IMPRESSION: No evidence of thoracic aortic aneurysm. Maximum diameter 3.7 cm in the ascending thoracic aorta.   Coronary artery disease.  TTE 07/09/20: IMPRESSIONS   1. Left ventricular ejection fraction, by estimation, is 60 to 65%. The  left ventricle has normal function. The left ventricle has no regional  wall motion abnormalities. The left ventricular internal cavity size was  mildly dilated. There is mild  concentric left ventricular hypertrophy. Left ventricular diastolic  parameters are consistent with Grade I diastolic dysfunction (impaired  relaxation).   2. Right ventricular systolic function is normal. The right ventricular  size is normal.   3. The mitral valve is normal in structure. No evidence of mitral valve  regurgitation. No evidence of mitral stenosis.   4. The aortic valve is normal in structure. Aortic valve regurgitation is  not visualized. No aortic stenosis is present.   5. Aortic dilatation noted. There is borderline dilatation of the aortic  root, measuring 39 mm. There is borderline dilatation of the ascending  aorta, measuring 39 mm.   6. The inferior vena cava is normal in size with greater than 50%  respiratory variability, suggesting right atrial pressure of 3 mmHg.   7. Trivial pericardial effusion is present. The pericardial effusion is  circumferential.   EKG:  EKG is personally reviewed. SB with HR 51  Recent Labs: No results found  for requested labs within last 365 days.  Recent Lipid Panel    Component Value Date/Time   CHOL 106 12/11/2020 0944   TRIG 103 12/11/2020 0944   HDL 39 (L) 12/11/2020 0944   CHOLHDL 2.7 12/11/2020 0944   LDLCALC 48 12/11/2020 0944      Physical Exam:    VS:  BP 114/74   Pulse (!) 51   Ht 6\' 2"  (1.88 m)   Wt 292 lb 6.4 oz (132.6 kg)   SpO2 97%   BMI 37.54 kg/m     Wt Readings from Last 3 Encounters:  06/22/22 292 lb 6.4 oz (132.6 kg)  12/10/21 281 lb 12.8 oz (127.8 kg)  07/15/21 276 lb 4.8 oz (125.3 kg)     GEN:  Comfortable, well appearing HEENT: Normal NECK: No JVD; No carotid bruits CARDIAC: Bradycardic, regular, no murmurs RESPIRATORY:  Clear bilaterally ABDOMEN: Soft, nontender, nondistended MUSCULOSKELETAL:  No edema; No  deformity  SKIN: Warm and dry NEUROLOGIC:  Alert and oriented x 3 PSYCHIATRIC:  Normal affect   ASSESSMENT:    1. Coronary artery disease involving native coronary artery of native heart with angina pectoris (HCC)   2. Hyperlipidemia, unspecified hyperlipidemia type   3. Primary hypertension   4. Aortic dilatation (HCC)   5. Morbid obesity (HCC)    PLAN:    In order of problems listed above:  #CAD s/p LAD PCI in 2005: Doing well with no anginal or HF symptoms. Compliant with all medications. LVEF 60-65% on TTE 06/2022. -Continue ASA 81mg  daily -Continue lipitor 80mg  daily -Continue lisinopril 20mg  daily -Continue zetia 10mg  daily  #HTN: Well controlled and at goal <120/80s. -Continue dilt 240mg  daily -Continue lisinopril-HCTZ 20-12.5mg  daily -Will need to monitor BP with weight loss as may need less medication  #HLD: LDL well controlled at 55. Goal <70. -Continue lipitor 80mg  daily -Continue zetia 10mg  daily -Will repeat labs with PCP (next month)  #History of Morbid Obesity with BMI 53 s/p robotic sleeve gastrectomy with BMI 36: S/p bariatric surgery with >150lb weight loss!! BMI now 37. Continuing to work on lifestyle  modifications  #Mild dilation of Aorta: Measures 39mm on TTE 07/2020 and stable at 3.7cm on TTE 06/2022.  Medication Adjustments/Labs and Tests Ordered: Current medicines are reviewed at length with the patient today.  Concerns regarding medicines are outlined above.  No orders of the defined types were placed in this encounter.  No orders of the defined types were placed in this encounter.   There are no Patient Instructions on file for this visit.    Signed,  Laurance Flatten, MD 06/22/2022 8:22 AM    [facility]

## 2022-06-22 ENCOUNTER — Ambulatory Visit: Payer: BLUE CROSS/BLUE SHIELD | Attending: Cardiology | Admitting: Cardiology

## 2022-06-22 VITALS — BP 114/74 | HR 51 | Ht 74.0 in | Wt 292.4 lb

## 2022-06-22 DIAGNOSIS — I25119 Atherosclerotic heart disease of native coronary artery with unspecified angina pectoris: Secondary | ICD-10-CM

## 2022-06-22 DIAGNOSIS — I77819 Aortic ectasia, unspecified site: Secondary | ICD-10-CM

## 2022-06-22 DIAGNOSIS — I10 Essential (primary) hypertension: Secondary | ICD-10-CM

## 2022-06-22 DIAGNOSIS — E785 Hyperlipidemia, unspecified: Secondary | ICD-10-CM | POA: Diagnosis not present

## 2022-06-22 NOTE — Patient Instructions (Signed)
Medication Instructions:   Your physician recommends that you continue on your current medications as directed. Please refer to the Current Medication list given to you today.  *If you need a refill on your cardiac medications before your next appointment, please call your pharmacy*    Follow-Up: At [facility], you and your health needs are our priority.  As part of our continuing mission to provide you with exceptional heart care, we have created designated Provider Care Teams.  These Care Teams include your primary Cardiologist (physician) and Advanced Practice Providers (APPs -  Physician Assistants and Nurse Practitioners) who all work together to provide you with the care you need, when you need it.  We recommend signing up for the patient portal called "MyChart".  Sign up information is provided on this After Visit Summary.  MyChart is used to connect with patients for Virtual Visits (Telemedicine).  Patients are able to view lab/test results, encounter notes, upcoming appointments, etc.  Non-urgent messages can be sent to your provider as well.   To learn more about what you can do with MyChart, go to [email or web address].    Your next appointment:   6 month(s)  Provider:   Dr. Lennie Odor

## 2022-07-03 ENCOUNTER — Telehealth: Payer: Self-pay | Admitting: Cardiology

## 2022-07-03 NOTE — Telephone Encounter (Signed)
Caller is following up on form faxed on 6/11 regarding patient's FMLA (ref# [id number]).  Caller stated they will need the form completed by Dr. Shari Prows and faxed back to fax# [phone].

## 2022-07-22 NOTE — Telephone Encounter (Signed)
Spoke to the patient, he called asking for an update pertaining to his FMLA forms. Explained to the patient, our office has not received FMLA from [place] and asked if they can refax the documents.  Provided the fax number to the patient. Patient voiced understanding.

## 2022-07-22 NOTE — Telephone Encounter (Signed)
Caller wants call back on status of patient's FMLA form.  Ref# FMLA ref# [id number].  Caller noted form was submitted 6/11.

## 2022-08-06 DIAGNOSIS — Z1321 Encounter for screening for nutritional disorder: Secondary | ICD-10-CM | POA: Diagnosis not present

## 2022-08-06 DIAGNOSIS — Z1329 Encounter for screening for other suspected endocrine disorder: Secondary | ICD-10-CM | POA: Diagnosis not present

## 2022-08-06 DIAGNOSIS — Z125 Encounter for screening for malignant neoplasm of prostate: Secondary | ICD-10-CM | POA: Diagnosis not present

## 2022-08-06 DIAGNOSIS — R5381 Other malaise: Secondary | ICD-10-CM | POA: Diagnosis not present

## 2022-08-06 DIAGNOSIS — Z0000 Encounter for general adult medical examination without abnormal findings: Secondary | ICD-10-CM | POA: Diagnosis not present

## 2022-08-06 DIAGNOSIS — E7849 Other hyperlipidemia: Secondary | ICD-10-CM | POA: Diagnosis not present

## 2022-08-06 DIAGNOSIS — R7301 Impaired fasting glucose: Secondary | ICD-10-CM | POA: Diagnosis not present

## 2022-08-07 NOTE — Telephone Encounter (Signed)
Spoke with patient who said that he does not need for [place] to complete the disability form for [employer]; to discard paperwork.  He said that he has Medicare disability now and does not need the other. He said that he would call [employer] today and let them know.

## 2022-08-13 DIAGNOSIS — E7849 Other hyperlipidemia: Secondary | ICD-10-CM | POA: Diagnosis not present

## 2022-08-13 DIAGNOSIS — Z6838 Body mass index (BMI) 38.0-38.9, adult: Secondary | ICD-10-CM | POA: Diagnosis not present

## 2022-08-13 DIAGNOSIS — G4733 Obstructive sleep apnea (adult) (pediatric): Secondary | ICD-10-CM | POA: Diagnosis not present

## 2022-08-13 DIAGNOSIS — Z0001 Encounter for general adult medical examination with abnormal findings: Secondary | ICD-10-CM | POA: Diagnosis not present

## 2022-08-13 DIAGNOSIS — I2510 Atherosclerotic heart disease of native coronary artery without angina pectoris: Secondary | ICD-10-CM | POA: Diagnosis not present

## 2022-08-13 DIAGNOSIS — I10 Essential (primary) hypertension: Secondary | ICD-10-CM | POA: Diagnosis not present

## 2022-08-13 DIAGNOSIS — F331 Major depressive disorder, recurrent, moderate: Secondary | ICD-10-CM | POA: Diagnosis not present

## 2022-08-13 DIAGNOSIS — Z1331 Encounter for screening for depression: Secondary | ICD-10-CM | POA: Diagnosis not present

## 2022-08-31 NOTE — Telephone Encounter (Signed)
Received new fmla forms on 08/28/2022 from [employer]. Called pt to confirm that we still do not need to complete forms. Discarding pw.

## 2022-09-09 ENCOUNTER — Telehealth: Payer: Self-pay | Admitting: Cardiovascular Disease

## 2022-09-09 NOTE — Telephone Encounter (Signed)
Call to [employer] [facility] ref # [id number] . Advised they were received and that patient confirmed they were not needed and to discard.  They will notate this  on their end.

## 2022-09-09 NOTE — Telephone Encounter (Signed)
Office is requesting a callback regarding finding out if disability form was received and filled out yet due to them needing it back by Friday. Reference number [id number] Please advise

## 2022-11-26 ENCOUNTER — Other Ambulatory Visit: Payer: Self-pay | Admitting: Medical Genetics

## 2022-11-26 ENCOUNTER — Other Ambulatory Visit (HOSPITAL_COMMUNITY)
Admission: RE | Admit: 2022-11-26 | Discharge: 2022-11-26 | Disposition: A | Discharge status: Home / Self Care | Payer: Self-pay | Source: Ambulatory Visit | Attending: Medical Genetics | Admitting: Medical Genetics

## 2022-11-26 DIAGNOSIS — Z006 Encounter for examination for normal comparison and control in clinical research program: Secondary | ICD-10-CM

## 2022-11-30 DIAGNOSIS — L719 Rosacea, unspecified: Secondary | ICD-10-CM | POA: Diagnosis not present

## 2022-11-30 DIAGNOSIS — L570 Actinic keratosis: Secondary | ICD-10-CM | POA: Diagnosis not present

## 2022-11-30 DIAGNOSIS — R202 Paresthesia of skin: Secondary | ICD-10-CM | POA: Diagnosis not present

## 2022-12-08 LAB — GENECONNECT MOLECULAR SCREEN: Genetic Analysis Overall Interpretation: NEGATIVE

## 2022-12-15 NOTE — Progress Notes (Unsigned)
Cardiology Office Note:  .   Date:  12/16/2022  ID:  Nathan Pope, DOB [birth year], MRN [id number] PCP: Richardean Chimera, MD  [facility] Providers Cardiologist:  None { History of Present Illness: .   Nathan Pope is a 62 y.o. male with history of CAD, HTN, HLD who presents for follow-up.   History of Present Illness   Nathan Pope, a 62 year old male with a history of PCI in 2005 to the LAD, hypertension, hyperlipidemia, and obesity with a BMI of 39, presents for follow-up. He denies having a heart attack in 2005 but reports noticing sluggishness, constant indigestion, and exhaustion after minimal physical activity, which led to a stress test and subsequent catheterization. A 95% blockage was found and stented, which immediately improved his symptoms. Since then, he has had no further stent placements or cardiac issues. A couple of years ago, he underwent weight loss surgery and lost about 150 pounds. He walks two miles a day without chest pain and recently completed his first 5K walk. He tries to maintain a balanced diet with occasional indulgences. He had an echocardiogram earlier this year, which was reported as normal. He also had a CT scan, which was normal. His EKG this summer was also normal. He has been working on muscle toning after significant weight loss.          Problem List CAD -PCI LAD 2005 2. HLD -T chol 117, HDL 57, LDL 55, TG 58 3. HTN 4. Morbid obesity  -s/p bariatric surgery    ROS: All other ROS reviewed and negative. Pertinent positives noted in the HPI.     Studies Reviewed: Marland Kitchen        TTE 06/15/2022  1. Left ventricular ejection fraction, by estimation, is 60 to 65%. The  left ventricle has normal function. The left ventricle has no regional  wall motion abnormalities. Left ventricular diastolic parameters were  normal.   2. Right ventricular systolic function is normal. The right ventricular  size is normal. There is normal pulmonary artery  systolic pressure.   3. The mitral valve is normal in structure. No evidence of mitral valve  regurgitation. No evidence of mitral stenosis.   4. The aortic valve is normal in structure. Aortic valve regurgitation is  not visualized. No aortic stenosis is present. Proximal ascending aorta  measured at 3.7cm.   5. The inferior vena cava is normal in size with greater than 50%  respiratory variability, suggesting right atrial pressure of 3 mmHg.  Physical Exam:   VS:  BP 114/74 (BP Location: Left Arm, Patient Position: Sitting, Cuff Size: Normal)   Pulse 66   Ht 6\' 2"  (1.88 m)   Wt (!) 308 lb 6.4 oz (139.9 kg)   SpO2 97%   BMI 39.60 kg/m    Wt Readings from Last 3 Encounters:  12/16/22 (!) 308 lb 6.4 oz (139.9 kg)  06/22/22 292 lb 6.4 oz (132.6 kg)  12/10/21 281 lb 12.8 oz (127.8 kg)    GEN: Well nourished, well developed in no acute distress NECK: No JVD; No carotid bruits CARDIAC: RRR, no murmurs, rubs, gallops RESPIRATORY:  Clear to auscultation without rales, wheezing or rhonchi  ABDOMEN: Soft, non-tender, non-distended EXTREMITIES:  No edema; No deformity  ASSESSMENT AND PLAN: .   Assessment and Plan    Coronary Artery Disease History of PCI to LAD in 2005, no current symptoms of angina. LDL at goal (55). -Continue Aspirin 81mg  daily, Lipitor 80mg  daily, and Zetia 10mg   daily.  Hypertension Well controlled with current regimen. -Continue Lisinopril-HCTZ 20-12.5mg  daily and Diltiazem 240mg  daily.  Obesity BMI 39, recent weight loss of 150 lbs, currently working on diet and exercise (walking 2 miles daily). -Encourage continued weight loss efforts.  Follow-up in 1 year.              Follow-up: Return in about 1 year (around 12/16/2023).  Time Spent with Patient: I have spent a total of 25 minutes caring for this patient today face to face, ordering and reviewing labs/tests, reviewing prior records/medical history, examining the patient, establishing an assessment  and plan, communicating results/findings to the patient/family, and documenting in the medical record.   Signed, Lenna Gilford. Flora Lipps, MD, [facility]  [facility]  [address], [address] [place], [place] [address] [phone]  8:44 AM

## 2022-12-16 ENCOUNTER — Ambulatory Visit: Payer: Medicare PPO | Attending: Cardiovascular Disease | Admitting: Cardiovascular Disease

## 2022-12-16 ENCOUNTER — Encounter: Payer: Self-pay | Admitting: Cardiovascular Disease

## 2022-12-16 VITALS — BP 114/74 | HR 66 | Ht 74.0 in | Wt 308.4 lb

## 2022-12-16 DIAGNOSIS — E785 Hyperlipidemia, unspecified: Secondary | ICD-10-CM

## 2022-12-16 DIAGNOSIS — I25119 Atherosclerotic heart disease of native coronary artery with unspecified angina pectoris: Secondary | ICD-10-CM

## 2022-12-16 DIAGNOSIS — I10 Essential (primary) hypertension: Secondary | ICD-10-CM

## 2022-12-16 NOTE — Patient Instructions (Signed)
Medication Instructions:  None    *If you need a refill on your cardiac medications before your next appointment, please call your pharmacy*   Lab Work: None    If you have labs (blood work) drawn today and your tests are completely normal, you will receive your results only by: MyChart Message (if you have MyChart) OR A paper copy in the mail If you have any lab test that is abnormal or we need to change your treatment, we will call you to review the results.   Testing/Procedures: None    Follow-Up: At [facility], you and your health needs are our priority.  As part of our continuing mission to provide you with exceptional heart care, we have created designated Provider Care Teams.  These Care Teams include your primary Cardiologist (physician) and Advanced Practice Providers (APPs -  Physician Assistants and Nurse Practitioners) who all work together to provide you with the care you need, when you need it.  We recommend signing up for the patient portal called "MyChart".  Sign up information is provided on this After Visit Summary.  MyChart is used to connect with patients for Virtual Visits (Telemedicine).  Patients are able to view lab/test results, encounter notes, upcoming appointments, etc.  Non-urgent messages can be sent to your provider as well.   To learn more about what you can do with MyChart, go to [email or web address].    Your next appointment:   1 year(s)  The format for your next appointment:   In Person  Provider:   Edd Fabian, FNP, Micah Flesher, PA-C, Marjie Skiff, PA-C, Robet Leu, PA-C, Juanda Crumble, PA-C, Joni Reining, DNP, ANP, Azalee Course, PA-C, Bernadene Person, NP, or Reather Littler, NP       Other Instructions

## 2023-02-09 DIAGNOSIS — I10 Essential (primary) hypertension: Secondary | ICD-10-CM | POA: Diagnosis not present

## 2023-02-09 DIAGNOSIS — E7849 Other hyperlipidemia: Secondary | ICD-10-CM | POA: Diagnosis not present

## 2023-02-09 DIAGNOSIS — E782 Mixed hyperlipidemia: Secondary | ICD-10-CM | POA: Diagnosis not present

## 2023-02-16 DIAGNOSIS — G4733 Obstructive sleep apnea (adult) (pediatric): Secondary | ICD-10-CM | POA: Diagnosis not present

## 2023-02-16 DIAGNOSIS — Z6841 Body Mass Index (BMI) 40.0 and over, adult: Secondary | ICD-10-CM | POA: Diagnosis not present

## 2023-02-16 DIAGNOSIS — I10 Essential (primary) hypertension: Secondary | ICD-10-CM | POA: Diagnosis not present

## 2023-02-16 DIAGNOSIS — E782 Mixed hyperlipidemia: Secondary | ICD-10-CM | POA: Diagnosis not present

## 2023-04-19 DIAGNOSIS — H2513 Age-related nuclear cataract, bilateral: Secondary | ICD-10-CM | POA: Diagnosis not present

## 2023-04-19 DIAGNOSIS — H43822 Vitreomacular adhesion, left eye: Secondary | ICD-10-CM | POA: Diagnosis not present

## 2023-04-21 DIAGNOSIS — H2513 Age-related nuclear cataract, bilateral: Secondary | ICD-10-CM | POA: Diagnosis not present

## 2023-04-21 DIAGNOSIS — H43823 Vitreomacular adhesion, bilateral: Secondary | ICD-10-CM | POA: Diagnosis not present

## 2023-04-27 IMAGING — CR DG CHEST 2V
3 series · 3 of 3 positions shown · non-contrast
Comparison: October 28, 2004

CLINICAL DATA: Preoperative evaluation.

EXAM:
CHEST - 2 VIEW

[w pa chest (1 of 2)]
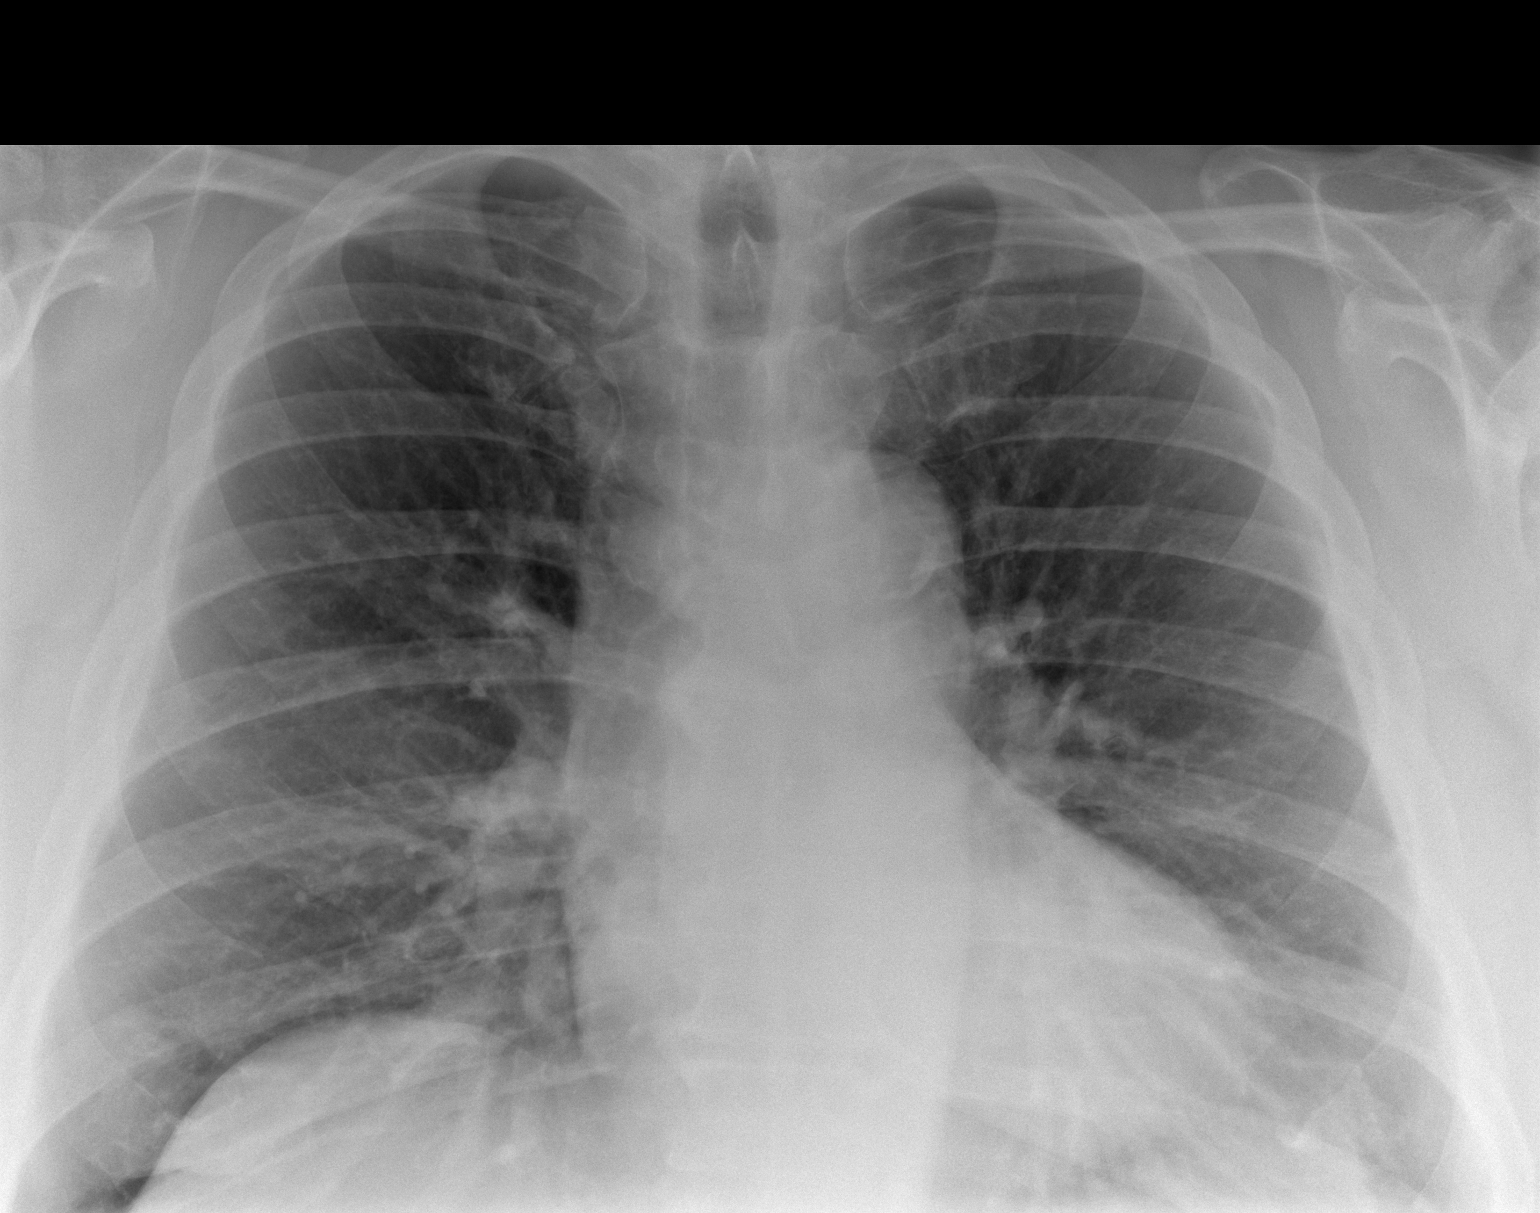

[w pa chest (2 of 2)]
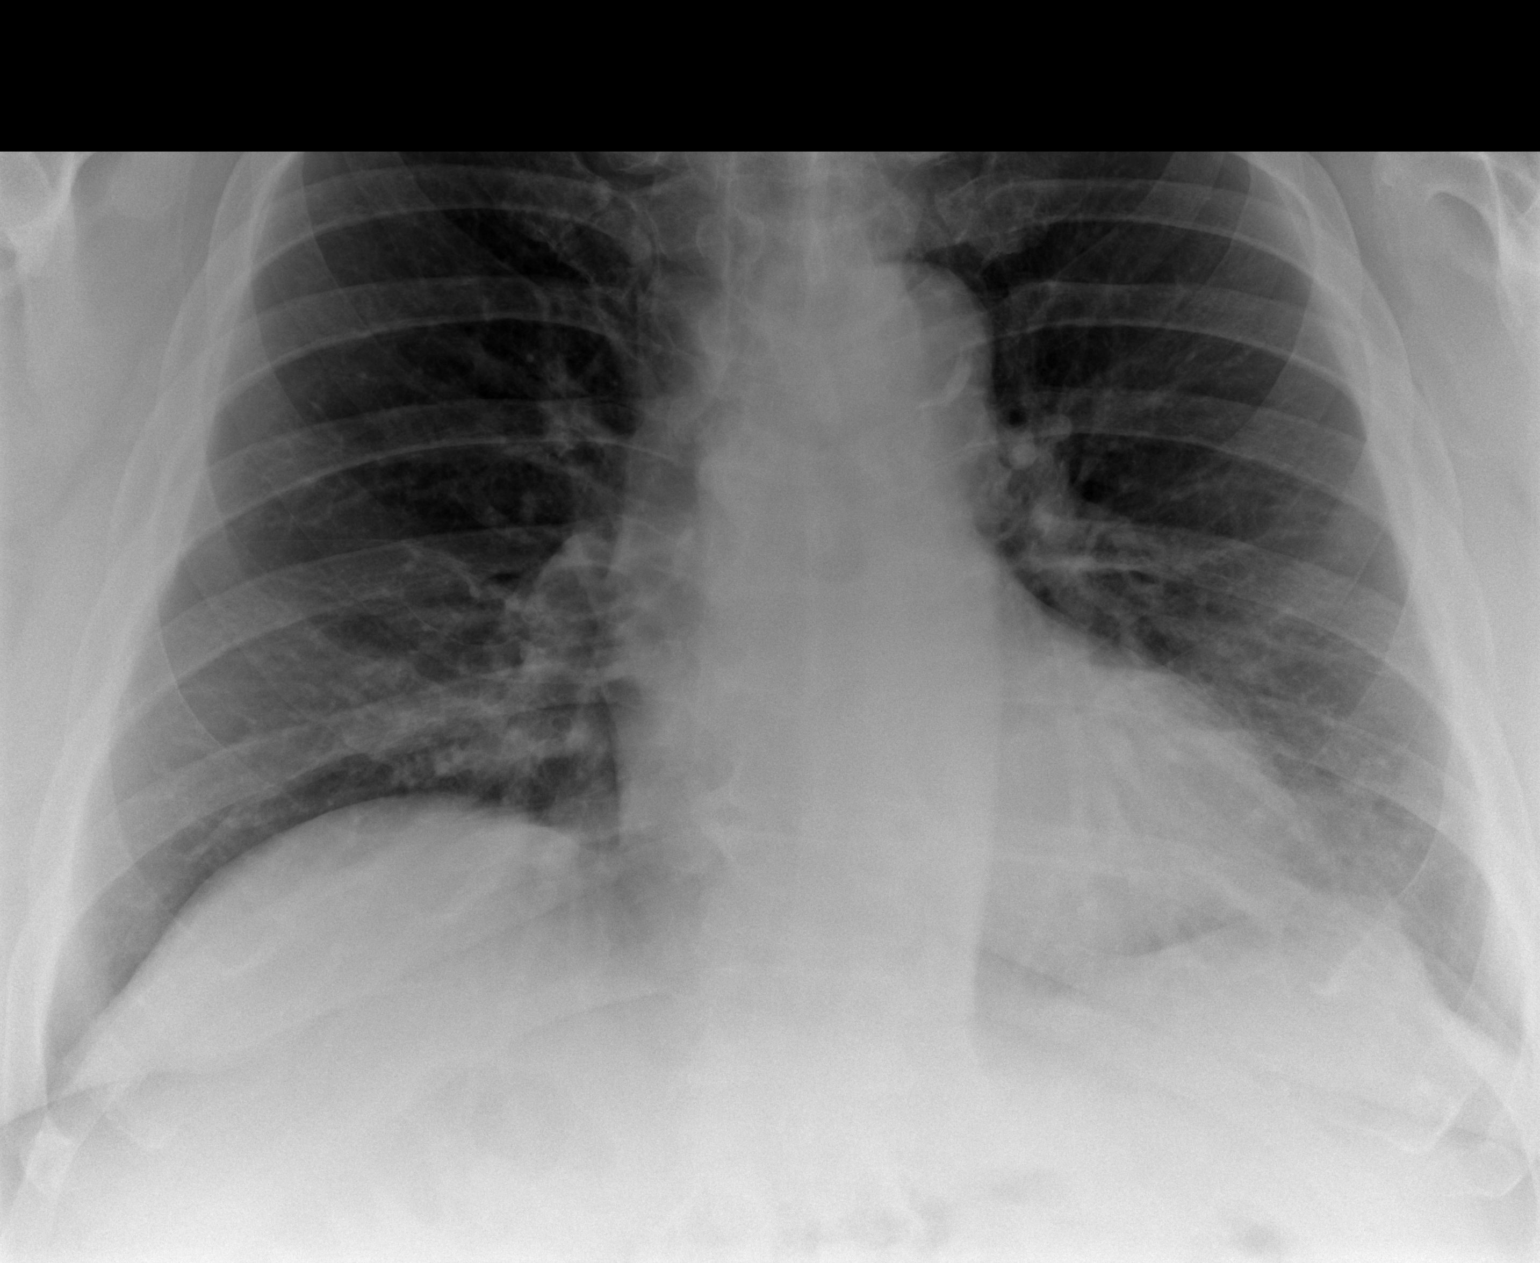

[w chest lat]
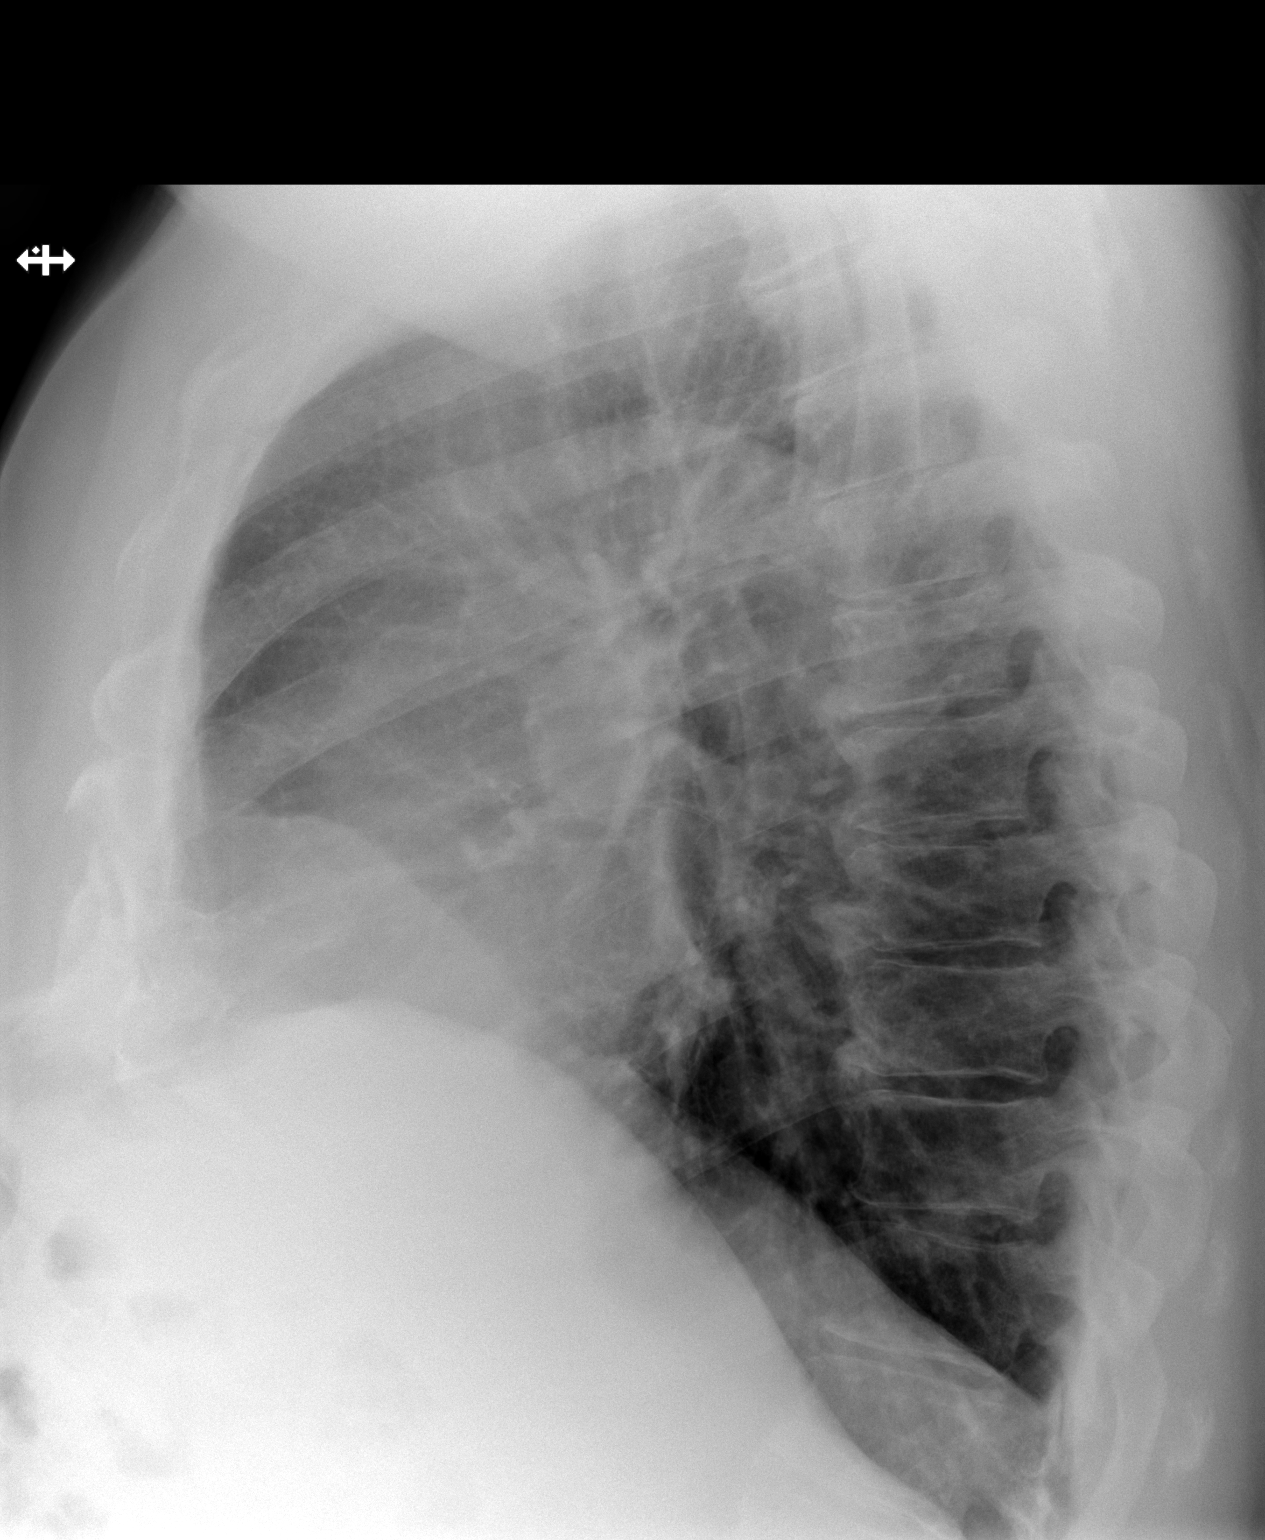

[3 of 3 positions shown; findings below may reference images not displayed]

FINDINGS: There is no evidence of acute infiltrate, pleural effusion or
pneumothorax. The heart size and mediastinal contours are within
normal limits. There is moderate severity calcification of the
aortic arch. Degenerative changes seen throughout the thoracic
spine.
IMPRESSION: No active cardiopulmonary disease.

## 2023-05-21 ENCOUNTER — Encounter (HOSPITAL_COMMUNITY): Payer: Self-pay | Admitting: *Deleted

## 2023-08-09 DIAGNOSIS — Z1321 Encounter for screening for nutritional disorder: Secondary | ICD-10-CM | POA: Diagnosis not present

## 2023-08-09 DIAGNOSIS — D559 Anemia due to enzyme disorder, unspecified: Secondary | ICD-10-CM | POA: Diagnosis not present

## 2023-08-09 DIAGNOSIS — R351 Nocturia: Secondary | ICD-10-CM | POA: Diagnosis not present

## 2023-08-09 DIAGNOSIS — Z125 Encounter for screening for malignant neoplasm of prostate: Secondary | ICD-10-CM | POA: Diagnosis not present

## 2023-08-09 DIAGNOSIS — Z1329 Encounter for screening for other suspected endocrine disorder: Secondary | ICD-10-CM | POA: Diagnosis not present

## 2023-08-09 DIAGNOSIS — I10 Essential (primary) hypertension: Secondary | ICD-10-CM | POA: Diagnosis not present

## 2023-08-09 DIAGNOSIS — E7849 Other hyperlipidemia: Secondary | ICD-10-CM | POA: Diagnosis not present

## 2023-08-09 DIAGNOSIS — E559 Vitamin D deficiency, unspecified: Secondary | ICD-10-CM | POA: Diagnosis not present

## 2023-08-16 DIAGNOSIS — Z6841 Body Mass Index (BMI) 40.0 and over, adult: Secondary | ICD-10-CM | POA: Diagnosis not present

## 2023-08-16 DIAGNOSIS — I10 Essential (primary) hypertension: Secondary | ICD-10-CM | POA: Diagnosis not present

## 2023-08-16 DIAGNOSIS — E7849 Other hyperlipidemia: Secondary | ICD-10-CM | POA: Diagnosis not present

## 2023-08-16 DIAGNOSIS — G4733 Obstructive sleep apnea (adult) (pediatric): Secondary | ICD-10-CM | POA: Diagnosis not present

## 2023-08-16 DIAGNOSIS — E782 Mixed hyperlipidemia: Secondary | ICD-10-CM | POA: Diagnosis not present

## 2023-10-20 ENCOUNTER — Ambulatory Visit: Admitting: Family

## 2023-10-20 ENCOUNTER — Encounter: Payer: Self-pay | Admitting: Family

## 2023-10-20 ENCOUNTER — Other Ambulatory Visit: Payer: Self-pay

## 2023-10-20 DIAGNOSIS — M1712 Unilateral primary osteoarthritis, left knee: Secondary | ICD-10-CM

## 2023-10-20 DIAGNOSIS — M25562 Pain in left knee: Secondary | ICD-10-CM | POA: Diagnosis not present

## 2023-10-20 DIAGNOSIS — G8929 Other chronic pain: Secondary | ICD-10-CM

## 2023-10-20 NOTE — Progress Notes (Signed)
 Office Visit Note   Patient: Nathan Pope           Date of Birth: [birth year]           MRN: [id number] Visit Date: 10/20/2023              Requested by: Toribio Jerel MATSU, MD [address] [place],  [place] [address] PCP: Toribio Jerel MATSU, MD  Chief Complaint  Patient presents with   Left Knee - Pain      HPI: The patient is a 63 year old gentleman who presents today complaining of gradually worsening left knee pain.  Aching constant pain this is significantly worse if he does any increased activities or incline decline ambulation.  He reports he has had to decrease his daily walks from 3 miles to 1 mile has not had any locking catching or giving way  His ADLs have been altered due to his left knee pain  He had right total knee arthroplasty several years ago and has done well  Is interested in proceeding with left total knee arthroplasty after the holidays.  Would like to set up outpatient PT with [facility] after surgery  Assessment & Plan: Visit Diagnoses:  1. Chronic pain of left knee     Plan: Will post for left total knee arthroplasty in January at his convenience.  Follow-Up Instructions: No follow-ups on file.   Left Knee Exam   Muscle Strength  The patient has normal left knee strength.  Tenderness  The patient is experiencing tenderness in the medial joint line.  Tests  Varus: negative Valgus: negative  Other  Erythema: absent Swelling: mild Effusion: no effusion present      Patient is alert, oriented, no adenopathy, well-dressed, normal affect, normal respiratory effort.     Imaging: No results found. No images are attached to the encounter.  Labs: No results found for: HGBA1C, ESRSEDRATE, CRP, LABURIC, REPTSTATUS, GRAMSTAIN, CULT, LABORGA   Lab Results  Component Value Date   ALBUMIN 4.3 10/15/2020    No results found for: MG No results found for: VD25OH  No results found for: PREALBUMIN    Latest Ref Rng &  Units 04/16/2021    9:40 AM 10/16/2020    4:28 AM 10/15/2020    7:00 AM  CBC EXTENDED  WBC 4.0 - 10.5 K/uL 5.7  12.0  6.7   RBC 4.22 - 5.81 MIL/uL 4.34  4.27  4.74   Hemoglobin 13.0 - 17.0 g/dL 86.1  86.2  84.8   HCT 39.0 - 52.0 % 41.8  39.4  44.8   Platelets 150 - 400 K/uL 221  PLATELET CLUMPS NOTED ON SMEAR, COUNT APPEARS DECREASED  219   NEUT# 1.7 - 7.7 K/uL  10.4  4.4   Lymph# 0.7 - 4.0 K/uL  0.9  1.6      There is no height or weight on file to calculate BMI.  Orders:  Orders Placed This Encounter  Procedures   XR Knee 1-2 Views Left   No orders of the defined types were placed in this encounter.    Procedures: No procedures performed  Clinical Data: No additional findings.  ROS:  All other systems negative, except as noted in the HPI. Review of Systems  Objective: Vital Signs: There were no vitals taken for this visit.  Specialty Comments:  No specialty comments available.  PMFS History: Patient Active Problem List   Diagnosis Date Noted   Total knee replacement status, right 04/25/2021  Unilateral primary osteoarthritis, right knee    Morbid obesity with BMI of 50.0-59.9, adult (HCC) 10/15/2020   HYPERLIPIDEMIA 09/05/2008   Essential hypertension 09/05/2008   Coronary atherosclerosis 09/05/2008   Cardiac dysrhythmia 09/05/2008   Past Medical History:  Diagnosis Date   Anxiety    Arthritis    Cancer (HCC)    melanoma removed (mole on stomach)   Coronary artery disease    Hypercholesterolemia    Hypertension    Obesity    Osteoarthritis    PONV (postoperative nausea and vomiting)    Sleep apnea     Family History  Problem Relation Age of Onset   Diabetes Father    Arthritis Father    Hypertension Father    Hypercalcemia Maternal Grandfather     Past Surgical History:  Procedure Laterality Date   BARIATRIC SURGERY     BIOPSY  08/09/2020   Procedure: BIOPSY;  Surgeon: Lyndel Deward PARAS, MD;  Location: [facility];  Service:  General;;   CARDIAC SURGERY  2005   stent   ESOPHAGOGASTRODUODENOSCOPY N/A 08/09/2020   Procedure: ESOPHAGOGASTRODUODENOSCOPY (EGD);  Surgeon: Lyndel Deward PARAS, MD;  Location: [facility];  Service: General;  Laterality: N/A;   TOTAL KNEE ARTHROPLASTY Right 04/25/2021   Procedure: RIGHT TOTAL KNEE ARTHROPLASTY;  Surgeon: Harden Jerona GAILS, MD;  [facility];  Service: Orthopedics;  Laterality: Right;   UPPER GI ENDOSCOPY N/A 10/15/2020   Procedure: UPPER GI ENDOSCOPY;  Surgeon: Lyndel Deward PARAS, MD;  Location: [facility];  Service: General;  Laterality: N/A;   Social History   Occupational History   Not on file  Tobacco Use   Smoking status: Never   Smokeless tobacco: Never  Vaping Use   Vaping status: Never Used  Substance and Sexual Activity   Alcohol  use: Not Currently   Drug use: No   Sexual activity: Not on file

## 2023-10-21 ENCOUNTER — Ambulatory Visit: Admitting: Orthopedic Surgery

## 2023-10-27 NOTE — H&P (View-Only) (Signed)
 Nathan Pope 63 y.o. 1960-01-20 Phone: [phone] (home)  Address: [address] [place] [address]  MRN: [id number] Primary MD : Referred Self  [facility]   Problem List Items Addressed This Visit     Encounter for colonoscopy due to history of adenomatous colonic polyps - Primary   The patient will be scheduled for a colonoscopy as an outpatient under anesthesia. The risks and benefits have been discussed with the patient and they understand, agree and wish to proceed. The patient will return to the office to discuss any pathologic findings. Otherwise, they will be given a printout of their results at the time of the procedure. The procedure was discussed with the patient including but not limited to bleeding, perforation, infection, missed lesions, splenic or liver injury, inability to complete the procedure and anesthetic risks. The prep was explained including the need to remain NPO prior to the procedure.  All questions were answered. Anticoagulants will be held prior to procedure.  Endoscopy Consult Note  Past Medical History[1] Past Surgical History[2] Allergies[3] Meds: Current Medications[4] SocHx:  reports that he has never smoked. He has never used smokeless tobacco. He reports that he does not currently use alcohol . He reports that he does not use drugs. FamHx: has no family status information on file.   Review of Systems ROS All systems reviewed and negative  Special Needs: No special needs identified History of Present Illness: Nathan Pope is a 63 y.o. male here for evaluation for Colonoscopy.   Last colonoscopy: 09/22/21 with me.  He had two tubular adenomas of the cecum and a fair to poor prep in the right colon.  A repeat colonoscopy in 2 years was recommended.  Diagnosis  Date Value Ref Range Status  09/22/2021   Final   Specimen 1: Colon, polyp(s), x2 radial jaw -  TUBULAR ADENOMA, TWO FRAGMENTS.  NO HIGH  GRADE DYSPLASIA OR MALIGNANCY.    I personally reviewed the procedure and pathology reports.  He denies nausea, vomiting, abdominal pain, anemia, change in bowel habits or unintentional weight loss.   He denies any blood in the toilet bowl, in the stool and on the toilet paper.  He has no family history of colon cancer or GI malignancy.He has no family history of inflammatory bowel disease or polyposis syndromes.    Reasons for testing: Routine screening by age  Current symptoms: none   Vitals:   10/28/23 0837  BP: 109/75  Pulse: 76  Temp: 36.6 C (97.9 F)   Physical Exam Constitutional: He is oriented to person, place, and time. He appears well-developed and well-nourished. No distress.   HENT:  Head: Normocephalic and atraumatic.  Nose:  Normal Mouth/Throat: Normal Eyes: Pupils are equal, round, and reactive to light. Conjunctivae are normal.  Neck: No JVD present. No tracheal deviation present. No thyromegaly present.   Cardiovascular: Normal rate, regular rhythm and normal heart sounds.  Exam reveals no gallop and no friction rub.   No murmur heard.  Pulmonary/Chest: Effort normal and breath sounds normal. No stridor. He has no wheezes. He has no rales.  He exhibits no tenderness.   Abdominal: Soft. Bowel sounds are normal. He exhibits no distension and no mass. There is no tenderness. There is no rebound and no guarding.   Musculoskeletal: Normal range of motion. He exhibits no tenderness or deformity.   Lymphadenopathy:  He has no cervical adenopathy.   Neurological: He is alert and oriented to person, place, and time.  No cranial nerve deficit. He exhibits normal muscle tone. Coordination normal.   Skin: Skin is warm and dry. No rash noted. He is not diaphoretic. No erythema. No pallor.   Psychiatric: He has a normal mood and affect. His behavior is normal. Judgment and thought content normal.                   [1] Past Medical  History: Diagnosis Date  . CAD (coronary artery disease) 2010  . Hyperlipidemia   . Hypertension   . Morbid obesity (CMS-HCC)   . Osteoarthritis   [2] Past Surgical History: Procedure Laterality Date  . CARDIAC CATHETERIZATION     stent  . Gastric sleeve    . JOINT REPLACEMENT     Right knee  . PR COLONOSCOPY W/BIOPSY SINGLE/MULTIPLE  09/22/2021   Procedure: COLONOSCOPY, FLEXIBLE, PROXIMAL TO SPLENIC FLEXURE; WITH BIOPSY, SINGLE OR MULTIPLE;  Surgeon: Duwaine Rollo Jumper, MD;  Location: ENDO OR [facility];  Service: General Surgery  [3] No Known Allergies [4]  Current Outpatient Medications:  .  acetaminophen  (TYLENOL ) 500 MG tablet, Take by mouth., Disp: , Rfl:  .  aspirin  (ECOTRIN) 81 MG tablet, Take 1 tablet (81 mg total) by mouth daily., Disp: , Rfl:  .  atorvastatin  (LIPITOR ) 80 MG tablet, Take 1 tablet (80 mg total) by mouth daily., Disp: , Rfl:  .  diltiazem  (CARTIA  XT) 240 MG 24 hr capsule, Take 1 capsule (240 mg total) by mouth daily., Disp: , Rfl:  .  doxycycline (ADOXA) 50 MG tablet, Take 1 tablet (50 mg total) by mouth daily., Disp: , Rfl:  .  ezetimibe  (ZETIA ) 10 mg tablet, Take 1 tablet (10 mg total) by mouth daily., Disp: , Rfl:  .  lisinopril  (PRINIVIL ,ZESTRIL ) 20 MG tablet, Take 1 tablet (20 mg total) by mouth daily., Disp: , Rfl:  .  multivitamin (TAB-A-VITE/THERAGRAN) per tablet, Take 1 tablet by mouth daily., Disp: , Rfl:

## 2023-10-28 DIAGNOSIS — Z860101 Personal history of adenomatous and serrated colon polyps: Secondary | ICD-10-CM | POA: Diagnosis not present

## 2023-10-28 DIAGNOSIS — Z1211 Encounter for screening for malignant neoplasm of colon: Secondary | ICD-10-CM | POA: Diagnosis not present

## 2023-11-08 ENCOUNTER — Encounter: Payer: Self-pay | Admitting: Radiology

## 2023-11-12 DIAGNOSIS — M1990 Unspecified osteoarthritis, unspecified site: Secondary | ICD-10-CM | POA: Diagnosis not present

## 2023-11-12 DIAGNOSIS — H269 Unspecified cataract: Secondary | ICD-10-CM | POA: Diagnosis not present

## 2023-11-12 DIAGNOSIS — I10 Essential (primary) hypertension: Secondary | ICD-10-CM | POA: Diagnosis not present

## 2023-11-12 DIAGNOSIS — Z6841 Body Mass Index (BMI) 40.0 and over, adult: Secondary | ICD-10-CM | POA: Diagnosis not present

## 2023-11-12 DIAGNOSIS — I2510 Atherosclerotic heart disease of native coronary artery without angina pectoris: Secondary | ICD-10-CM | POA: Diagnosis not present

## 2023-11-12 DIAGNOSIS — E785 Hyperlipidemia, unspecified: Secondary | ICD-10-CM | POA: Diagnosis not present

## 2023-11-12 DIAGNOSIS — L719 Rosacea, unspecified: Secondary | ICD-10-CM | POA: Diagnosis not present

## 2023-11-12 DIAGNOSIS — Z8249 Family history of ischemic heart disease and other diseases of the circulatory system: Secondary | ICD-10-CM | POA: Diagnosis not present

## 2023-11-19 DIAGNOSIS — I10 Essential (primary) hypertension: Secondary | ICD-10-CM | POA: Diagnosis not present

## 2023-11-19 DIAGNOSIS — I2510 Atherosclerotic heart disease of native coronary artery without angina pectoris: Secondary | ICD-10-CM | POA: Diagnosis not present

## 2023-11-19 DIAGNOSIS — E66812 Obesity, class 2: Secondary | ICD-10-CM | POA: Diagnosis not present

## 2023-11-19 DIAGNOSIS — E785 Hyperlipidemia, unspecified: Secondary | ICD-10-CM | POA: Diagnosis not present

## 2023-11-19 DIAGNOSIS — Z860101 Personal history of adenomatous and serrated colon polyps: Secondary | ICD-10-CM | POA: Diagnosis not present

## 2023-11-19 DIAGNOSIS — K5730 Diverticulosis of large intestine without perforation or abscess without bleeding: Secondary | ICD-10-CM | POA: Diagnosis not present

## 2023-11-19 DIAGNOSIS — D122 Benign neoplasm of ascending colon: Secondary | ICD-10-CM | POA: Diagnosis not present

## 2023-11-19 DIAGNOSIS — M1990 Unspecified osteoarthritis, unspecified site: Secondary | ICD-10-CM | POA: Diagnosis not present

## 2023-11-19 DIAGNOSIS — K648 Other hemorrhoids: Secondary | ICD-10-CM | POA: Diagnosis not present

## 2023-11-19 DIAGNOSIS — Z1211 Encounter for screening for malignant neoplasm of colon: Secondary | ICD-10-CM | POA: Diagnosis not present

## 2023-12-07 DIAGNOSIS — D122 Benign neoplasm of ascending colon: Secondary | ICD-10-CM | POA: Diagnosis not present

## 2023-12-13 NOTE — Progress Notes (Signed)
 Cardiology Office Note:  .   Date:  12/23/2023  ID:  BERGEN MAGNER, DOB 02-28-1960, MRN [id number] PCP: Toribio Jerel MATSU, MD  [facility] Providers Cardiologist:  Darryle ONEIDA Decent, MD   History of Present Illness: .   No chief complaint on file.   IDRISS QUACKENBUSH is a 63 y.o. male with below history who presents for follow-up.   History of Present Illness   NAVIN DOGAN is a 63 year old male with coronary artery disease and hyperlipidemia who presents for follow-up.  He has a history of coronary artery disease, status post percutaneous coronary intervention in 2005, and hyperlipidemia. He reports no chest discomfort or dyspnea. His cholesterol levels were checked in August and were well-controlled. He continues to take aspirin , Lipitor  80 mg, and Zetia  10 mg daily.  He is scheduled for a knee replacement on January 9th due to increasing knee pain, which limits his physical activity. He previously had a knee replacement on the other knee over two years ago. His knee pain worsens with activity and sometimes with weather changes, requiring rest after activity. Despite this, he manages to walk about a mile to a mile and a quarter daily, down from two to three miles a day last year.  His current medications include lisinopril , hydrochlorothiazide , diltiazem  (Cardia XT), and doxycycline as needed for rosacea. He also takes a multivitamin and a baby aspirin  daily. His blood pressure is well-controlled, typically ranging from 106/71 to 120/80. He experiences rare dizziness upon standing quickly, occurring about twice a year. He monitors his blood pressure at home two to three times a week and notes that his sleeping pulse rate is around 51-52 bpm, increasing to about 70 bpm during the day.           Problem List CAD -PCI LAD 2005 2. HLD -T chol 117, HDL 57, LDL 55, TG 58 3. HTN 4. Morbid obesity  -s/p bariatric surgery    ROS: All other ROS reviewed and negative. Pertinent  positives noted in the HPI.     Studies Reviewed: SABRA   EKG Interpretation Date/Time:  Thursday December 23 2023 08:31:25 EST Ventricular Rate:  62 PR Interval:  182 QRS Duration:  94 QT Interval:  394 QTC Calculation: 399 R Axis:   -29  Text Interpretation: Normal sinus rhythm Normal ECG Confirmed by Decent Darryle [phone]) on 12/23/2023 8:37:38 AM   TTE 06/15/2022  1. Left ventricular ejection fraction, by estimation, is 60 to 65%. The  left ventricle has normal function. The left ventricle has no regional  wall motion abnormalities. Left ventricular diastolic parameters were  normal.   2. Right ventricular systolic function is normal. The right ventricular  size is normal. There is normal pulmonary artery systolic pressure.   3. The mitral valve is normal in structure. No evidence of mitral valve  regurgitation. No evidence of mitral stenosis.   4. The aortic valve is normal in structure. Aortic valve regurgitation is  not visualized. No aortic stenosis is present. Proximal ascending aorta  measured at 3.7cm.   5. The inferior vena cava is normal in size with greater than 50%  respiratory variability, suggesting right atrial pressure of 3 mmHg.  Physical Exam:   VS:  BP 109/71 (BP Location: Right Arm, Patient Position: Sitting)   Pulse 62   Ht 6' 1 (1.854 m)   Wt (!) 337 lb 12.8 oz (153.2 kg)   SpO2 93%   BMI 44.57 kg/m  Wt Readings from Last 3 Encounters:  12/23/23 (!) 337 lb 12.8 oz (153.2 kg)  12/16/22 (!) 308 lb 6.4 oz (139.9 kg)  06/22/22 292 lb 6.4 oz (132.6 kg)    GEN: Well nourished, well developed in no acute distress NECK: No JVD; No carotid bruits CARDIAC: RRR, no murmurs, rubs, gallops RESPIRATORY:  Clear to auscultation without rales, wheezing or rhonchi  ABDOMEN: Soft, non-tender, non-distended EXTREMITIES:  No edema; No deformity  ASSESSMENT AND PLAN: .   Assessment and Plan    Preoperative cardiovascular risk assessment for knee replacement Cleared  for knee replacement surgery. No chest discomfort or dyspnea. Blood pressure and cholesterol levels controlled. EKG normal today. Can complete >4 METS without symptoms.  - Proceed with knee replacement surgery as scheduled. - Hold aspirin  5-7 days prior to surgery as per surgeon's instructions.  Coronary artery disease status post PCI Coronary artery disease with PCI in 2005. No angina. LDL cholesterol at 55 mg/dL. - Continue aspirin , Lipitor  80 mg, and Zetia  10 mg.  Primary hypertension Blood pressure controlled. Occasional orthostatic dizziness. No medication adjustment needed. - Continue diltiazem  240 mg and lisinopril  20 mg with HCTZ 12.5 mg. - Monitor blood pressure regularly and report increased dizziness.  Hyperlipidemia LDL cholesterol well controlled at 55 mg/dL. - Continue Lipitor  80 mg and Zetia  10 mg.                Follow-up: No follow-ups on file.  Signed, Darryle DASEN. Barbaraann, MD, [facility]  [facility]  [address] [place], [place] [address] [phone]  9:13 AM

## 2023-12-23 ENCOUNTER — Encounter: Payer: Self-pay | Admitting: Cardiovascular Disease

## 2023-12-23 ENCOUNTER — Ambulatory Visit: Admitting: Cardiovascular Disease

## 2023-12-23 ENCOUNTER — Telehealth: Payer: Self-pay | Admitting: Orthopedic Surgery

## 2023-12-23 VITALS — BP 109/71 | HR 62 | Ht 73.0 in | Wt 337.8 lb

## 2023-12-23 DIAGNOSIS — I10 Essential (primary) hypertension: Secondary | ICD-10-CM

## 2023-12-23 DIAGNOSIS — E785 Hyperlipidemia, unspecified: Secondary | ICD-10-CM | POA: Diagnosis not present

## 2023-12-23 DIAGNOSIS — Z01818 Encounter for other preprocedural examination: Secondary | ICD-10-CM | POA: Diagnosis not present

## 2023-12-23 DIAGNOSIS — I25119 Atherosclerotic heart disease of native coronary artery with unspecified angina pectoris: Secondary | ICD-10-CM | POA: Diagnosis not present

## 2023-12-23 NOTE — Patient Instructions (Signed)
 Medication Instructions:  Your physician recommends that you continue on your current medications as directed. Please refer to the Current Medication list given to you today.  *If you need a refill on your cardiac medications before your next appointment, please call your pharmacy*  Follow-Up: At [facility], you and your health needs are our priority.  As part of our continuing mission to provide you with exceptional heart care, our providers are all part of one team.  This team includes your primary Cardiologist (physician) and Advanced Practice Providers or APPs (Physician Assistants and Nurse Practitioners) who all work together to provide you with the care you need, when you need it.  Your next appointment:   1 year(s)  Provider:   One of our Advanced Practice Providers (APPs): Morse Clause, PA-C  Lamarr Satterfield, NP Miriam Shams, NP  Olivia Pavy, PA-C Josefa Beauvais, NP  Leontine Salen, PA-C Orren Fabry, PA-C  [place], PA-C Ernest Dick, NP  Damien Braver, NP Jon Hails, PA-C  Waddell Donath, PA-C    Dayna Dunn, PA-C  Scott Weaver, PA-C Lum Louis, NP Katlyn West, NP Callie Goodrich, PA-C  Xika Zhao, NP Sheng Haley, PA-C    Kathleen Johnson, PA-C

## 2023-12-23 NOTE — Telephone Encounter (Signed)
Faxed referral to number provided below

## 2023-12-23 NOTE — Telephone Encounter (Signed)
 Pt called and said that he needs a referral to PT place called [facility] In [place]. RA#[phone] fax#[phone] Attention Mattie

## 2023-12-24 ENCOUNTER — Telehealth: Payer: Self-pay | Admitting: Family

## 2023-12-24 NOTE — Telephone Encounter (Signed)
 Maddie from [facility] called and said can you send in another referral with when you want to start therapy. CB#[phone] option 2

## 2023-12-24 NOTE — Telephone Encounter (Signed)
 Faxed to state pt is having left [facility] 01/14/24 so start shortly after due to his insurance not approving of HHPT.

## 2024-01-05 NOTE — Progress Notes (Signed)
 Surgical Instructions   Your procedure is scheduled on Friday January 14, 2024. Report to [facility] Main Entrance A at 5:30 A.M., then check in with the Admitting office. Any questions or running late day of surgery: call [phone]  Questions prior to your surgery date: call [phone], Monday-Friday, 8am-4pm. If you experience any cold or flu symptoms such as cough, fever, chills, shortness of breath, etc. between now and your scheduled surgery, please notify us  at the above number.     Remember:  Do not eat after midnight the night before your surgery  You may drink clear liquids until 4:30 the morning of your surgery.   Clear liquids allowed are: Water , Non-Citrus Juices (without pulp), Carbonated Beverages, Clear Tea (no milk, honey, etc.), Black Coffee Only (NO MILK, CREAM OR POWDERED CREAMER of any kind), and Gatorade.    Take these medicines the morning of surgery with A SIP OF WATER   atorvastatin  (LIPITOR )  CARTIA  XT ezetimibe  (ZETIA )    May take these medicines IF NEEDED: doxycycline (VIBRAMYCIN)   PER YOUR CARDIOLOGISTS INSTRUCTIONS, PLEASE HOLD YOUR ASPIRIN  7 DAYS PRIOR TO SURGERY WITH THE LAST DOSE BEING 01/06/2024.   One week prior to surgery, STOP taking any Aleve, Naproxen, Ibuprofen, Motrin, Advil, Goody's, BC's, all herbal medications, fish oil, and non-prescription vitamins.                     Do NOT Smoke (Tobacco/Vaping) for 24 hours prior to your procedure.  If you use a CPAP at night, you may bring your mask/headgear for your overnight stay.   You will be asked to remove any contacts, glasses, piercing's, hearing aid's, dentures/partials prior to surgery. Please bring cases for these items if needed.    Patients discharged the day of surgery will not be allowed to drive home, and someone needs to stay with them for 24 hours.  SURGICAL WAITING ROOM VISITATION Patients may have no more than 2 support people in the waiting area - these visitors may  rotate.   Pre-op nurse will coordinate an appropriate time for 1 ADULT support person, who may not rotate, to accompany patient in pre-op.  Children under the age of 55 must have an adult with them who is not the patient and must remain in the main waiting area with an adult.  If the patient needs to stay at the hospital during part of their recovery, the visitor guidelines for inpatient rooms apply.  Please refer to the [facility] website for the visitor guidelines for any additional information.   If you received a COVID test during your pre-op visit  it is requested that you wear a mask when out in public, stay away from anyone that may not be feeling well and notify your surgeon if you develop symptoms. If you have been in contact with anyone that has tested positive in the last 10 days please notify you surgeon.      Pre-operative 4 CHG Bathing Instructions   You can play a key role in reducing the risk of infection after surgery. Your skin needs to be as free of germs as possible. You can reduce the number of germs on your skin by washing with CHG (chlorhexidine  gluconate) soap before surgery. CHG is an antiseptic soap that kills germs and continues to kill germs even after washing.   DO NOT use if you have an allergy to chlorhexidine /CHG or antibacterial soaps. If your skin becomes reddened or irritated, stop using the CHG and  notify one of our RNs at [phone].   Please shower with the CHG soap starting 4 days before surgery using the following schedule:     Please keep in mind the following:  You may shave your face at any point before/day of surgery.  Place clean sheets on your bed the day you start using CHG soap. Use a clean washcloth (not used since being washed) for each shower. DO NOT sleep with pets once you start using the CHG.   CHG Shower Instructions:  Wash your face and private area with normal soap. If you choose to wash your hair, wash first with your normal  shampoo.  After you use shampoo/soap, rinse your hair and body thoroughly to remove shampoo/soap residue.  Turn the water  OFF and apply  bottle of CHG soap to a CLEAN washcloth.  Apply CHG soap ONLY FROM YOUR NECK DOWN TO YOUR TOES (washing for 3-5 minutes)  DO NOT use CHG soap on face, private areas, open wounds, or sores.  Pay special attention to the area where your surgery is being performed.  If you are having back surgery, having someone wash your back for you may be helpful. Wait 2 minutes after CHG soap is applied, then you may rinse off the CHG soap.  Pat dry with a clean towel  Put on clean clothes/pajamas   If you choose to wear lotion, please use ONLY the CHG-compatible lotions that are listed below.  Additional instructions for the day of surgery:  If you choose, you may shower the morning of surgery with an antibacterial soap.  DO NOT APPLY any lotions, deodorants or cologne.   Do not bring valuables to the hospital. [facility] is not responsible for any belongings/valuables. Do not wear jewelry Put on clean/comfortable clothes.  Please brush your teeth.  Ask your nurse before applying any prescription medications to the skin.     CHG Compatible Lotions   Aveeno Moisturizing lotion  Cetaphil Moisturizing Cream  Cetaphil Moisturizing Lotion  Clairol Herbal Essence Moisturizing Lotion, Dry Skin  Clairol Herbal Essence Moisturizing Lotion, Extra Dry Skin  Clairol Herbal Essence Moisturizing Lotion, Normal Skin  Curel Age Defying Therapeutic Moisturizing Lotion with Alpha Hydroxy  Curel Extreme Care Body Lotion  Curel Soothing Hands Moisturizing Hand Lotion  Curel Therapeutic Moisturizing Cream, Fragrance-Free  Curel Therapeutic Moisturizing Lotion, Fragrance-Free  Curel Therapeutic Moisturizing Lotion, Original Formula  Eucerin Daily Replenishing Lotion  Eucerin Dry Skin Therapy Plus Alpha Hydroxy Crme  Eucerin Dry Skin Therapy Plus Alpha Hydroxy Lotion   Eucerin Original Crme  Eucerin Original Lotion  Eucerin Plus Crme Eucerin Plus Lotion  Eucerin TriLipid Replenishing Lotion  Keri Anti-Bacterial Hand Lotion  Keri Deep Conditioning Original Lotion Dry Skin Formula Softly Scented  Keri Deep Conditioning Original Lotion, Fragrance Free Sensitive Skin Formula  Keri Lotion Fast Absorbing Fragrance Free Sensitive Skin Formula  Keri Lotion Fast Absorbing Softly Scented Dry Skin Formula  Keri Original Lotion  Keri Skin Renewal Lotion Keri Silky Smooth Lotion  Keri Silky Smooth Sensitive Skin Lotion  Nivea Body Creamy Conditioning Oil  Nivea Body Extra Enriched Lotion  Nivea Body Original Lotion  Nivea Body Sheer Moisturizing Lotion Nivea Crme  Nivea Skin Firming Lotion  NutraDerm 30 Skin Lotion  NutraDerm Skin Lotion  NutraDerm Therapeutic Skin Cream  NutraDerm Therapeutic Skin Lotion  ProShield Protective Hand Cream  Provon moisturizing lotion  Please read over the following fact sheets that you were given.

## 2024-01-10 ENCOUNTER — Other Ambulatory Visit: Payer: Self-pay

## 2024-01-10 ENCOUNTER — Encounter (HOSPITAL_COMMUNITY)
Admission: RE | Admit: 2024-01-10 | Discharge: 2024-01-10 | Disposition: A | Discharge status: Home / Self Care | Source: Ambulatory Visit | Attending: Orthopedic Surgery | Admitting: Orthopedic Surgery

## 2024-01-10 ENCOUNTER — Encounter (HOSPITAL_COMMUNITY): Payer: Self-pay

## 2024-01-10 VITALS — BP 117/76 | HR 64 | Temp 98.2°F | Resp 18 | Ht 74.0 in | Wt 320.8 lb

## 2024-01-10 DIAGNOSIS — Z6841 Body Mass Index (BMI) 40.0 and over, adult: Secondary | ICD-10-CM | POA: Diagnosis not present

## 2024-01-10 DIAGNOSIS — I2510 Atherosclerotic heart disease of native coronary artery without angina pectoris: Secondary | ICD-10-CM | POA: Diagnosis not present

## 2024-01-10 DIAGNOSIS — G4733 Obstructive sleep apnea (adult) (pediatric): Secondary | ICD-10-CM | POA: Diagnosis not present

## 2024-01-10 DIAGNOSIS — Z96651 Presence of right artificial knee joint: Secondary | ICD-10-CM | POA: Insufficient documentation

## 2024-01-10 DIAGNOSIS — I10 Essential (primary) hypertension: Secondary | ICD-10-CM | POA: Diagnosis not present

## 2024-01-10 DIAGNOSIS — Z7982 Long term (current) use of aspirin: Secondary | ICD-10-CM | POA: Diagnosis not present

## 2024-01-10 DIAGNOSIS — Z85820 Personal history of malignant melanoma of skin: Secondary | ICD-10-CM | POA: Diagnosis not present

## 2024-01-10 DIAGNOSIS — Z01812 Encounter for preprocedural laboratory examination: Secondary | ICD-10-CM | POA: Insufficient documentation

## 2024-01-10 DIAGNOSIS — Z01818 Encounter for other preprocedural examination: Secondary | ICD-10-CM | POA: Diagnosis present

## 2024-01-10 DIAGNOSIS — Z955 Presence of coronary angioplasty implant and graft: Secondary | ICD-10-CM | POA: Insufficient documentation

## 2024-01-10 DIAGNOSIS — Z903 Acquired absence of stomach [part of]: Secondary | ICD-10-CM | POA: Insufficient documentation

## 2024-01-10 DIAGNOSIS — E669 Obesity, unspecified: Secondary | ICD-10-CM | POA: Insufficient documentation

## 2024-01-10 DIAGNOSIS — E7800 Pure hypercholesterolemia, unspecified: Secondary | ICD-10-CM | POA: Diagnosis not present

## 2024-01-10 DIAGNOSIS — M1712 Unilateral primary osteoarthritis, left knee: Secondary | ICD-10-CM | POA: Insufficient documentation

## 2024-01-10 LAB — BASIC METABOLIC PANEL WITH GFR
Anion gap: 7 (ref 5–15)
BUN: 16 mg/dL (ref 8–23)
CO2: 30 mmol/L (ref 22–32)
Calcium: 8.9 mg/dL (ref 8.9–10.3)
Chloride: 101 mmol/L (ref 98–111)
Creatinine, Ser: 0.84 mg/dL (ref 0.61–1.24)
GFR, Estimated: >60 mL/min
Glucose, Bld: 93 mg/dL (ref 70–99)
Potassium: 4.6 mmol/L (ref 3.5–5.1)
Sodium: 139 mmol/L (ref 135–145)

## 2024-01-10 LAB — CBC
HCT: 45.7 % (ref 39.0–52.0)
Hemoglobin: 15.4 g/dL (ref 13.0–17.0)
MCH: 32.6 pg (ref 26.0–34.0)
MCHC: 33.7 g/dL (ref 30.0–36.0)
MCV: 96.8 fL (ref 80.0–100.0)
Platelets: 210 K/uL (ref 150–400)
RBC: 4.72 MIL/uL (ref 4.22–5.81)
RDW: 12.9 % (ref 11.5–15.5)
WBC: 6.4 K/uL (ref 4.0–10.5)
nRBC: 0 % (ref 0.0–0.2)

## 2024-01-10 LAB — SURGICAL PCR SCREEN
MRSA, PCR: NEGATIVE
Staphylococcus aureus: NEGATIVE

## 2024-01-10 NOTE — Progress Notes (Signed)
 PCP - Dr. Jerel Sieving @ [facility] Cardiologist - Dr. Darryle O'Neal LOV 12-23-23  PPM/ICD - Denies Device Orders - n/a Rep Notified - n/a  Chest x-ray - n/a EKG - 12-23-23 Stress Test - 2005 ECHO - 06-15-22 Cardiac Cath - 2005 - stent placed  Sleep Study - yes CPAP - wears nightly  Non-diabetic  Last dose of GLP1 agonist-  denies GLP1 instructions: n/a  Blood Thinner Instructions: denies Aspirin  Instructions: hold for 7 day's.  Per patient last dose was on 01-06-24  ERAS Protcol - clears until 0430 PRE-SURGERY Ensure or G2- none  COVID TEST- n/a   Anesthesia review: yes, CAD, HTN, sleep apnea wears cpap nightly  Patient denies shortness of breath, fever, cough and chest pain at PAT appointment. Patient denies any respiratory issues at this time .   All instructions explained to the patient, with a verbal understanding of the material. Patient agrees to go over the instructions while at home for a better understanding. Patient also instructed to self quarantine after being tested for COVID-19. The opportunity to ask questions was provided.

## 2024-01-11 NOTE — Progress Notes (Signed)
 Anesthesia Chart Review:  Case: [id number] Date/Time: 01/14/24 0715   Procedure: ARTHROPLASTY, KNEE, TOTAL (Left: Knee)   Anesthesia type: Choice   Diagnosis: Primary osteoarthritis of left knee [M17.12]   Pre-op diagnosis: Osteoarthritis Left Knee   [facility]: [facility] ROOM 05 / [facility]   Surgeons: Harden Jerona GAILS, MD       DISCUSSION: Patient is a 64 year old male scheduled for the above procedure.  History includes never smoker, post-operative N/V, HTN, CAD (LAD stent 2005), hypercholesterolemia, melanoma (abdomen, s/p excision), OSA (uses CPAP), obesity (s/p robotic gastric sleeve resection 10/15/2020), osteoarthritis (s/p right TKA 04/25/2021). Last colonoscopy 11/19/2023.    Last cardiology visit with Dr. Dr. Barbaraann on 12/23/2023. He was initially evaluated in June 2022 for preoperative evaluation prior to bariatric surgery. Echo in 07/2020 showed normal LVF. Continue medical therapy. She did order a CTA chest to evaluate thoracic aortic dilation on echo-- done on 12/27/21 and ascending thoracic aorta measured 3.7 cm, no aneurysm. At recent follow-up, he was still able to walk about a mile+ daily. No angina. Last TTE in 06/2022 showed LVEF 60-65%, no RWMA, normal diastolic parameters, normal RV systolic function, normal PASP, no significant valvular disease, ascending aorta 3.7 cm.  Continue ASA, Lipitor , Zetia , diltiazem , lisinopril , hydrochlorothiazide . For Preoperative CV risk assessment, Cleared for knee replacement surgery. No chest discomfort or dyspnea. Blood pressure and cholesterol levels controlled. EKG normal today. Can complete >4 METS without symptoms.  - Proceed with knee replacement surgery as scheduled. - Hold aspirin  5-7 days prior to surgery as per surgeon's instructions.  Last ASA 01/06/2024. Anesthesia team to evaluate on the day of surgery.      VS: BP 117/76   Pulse 64   Temp 36.8 C   Resp 18   Ht 6' 2 (1.88 m)   Wt (!) 145.5 kg   SpO2 97%   BMI 41.19 kg/m     PROVIDERS: Toribio Jerel MATSU, MD is PCP Barbaraann Kotyk, MD is cardiologist    LABS: Labs reviewed: Acceptable for surgery. (all labs ordered are listed, but only abnormal results are displayed)  Labs Reviewed  SURGICAL PCR SCREEN  BASIC METABOLIC PANEL WITH GFR  CBC    EKG: 12/23/2023: NSR   CV: Echo 06/15/2022: IMPRESSIONS   1. Left ventricular ejection fraction, by estimation, is 60 to 65%. The  left ventricle has normal function. The left ventricle has no regional  wall motion abnormalities. Left ventricular diastolic parameters were  normal.   2. Right ventricular systolic function is normal. The right ventricular  size is normal. There is normal pulmonary artery systolic pressure.   3. The mitral valve is normal in structure. No evidence of mitral valve  regurgitation. No evidence of mitral stenosis.   4. The aortic valve is normal in structure. Aortic valve regurgitation is  not visualized. No aortic stenosis is present. Proximal ascending aorta  measured at 3.7cm.   5. The inferior vena cava is normal in size with greater than 50%  respiratory variability, suggesting right atrial pressure of 3 mmHg.    Past Medical History:  Diagnosis Date   Anxiety    Arthritis    Cancer (HCC)    melanoma removed (mole on stomach)   Coronary artery disease    Hypercholesterolemia    Hypertension    Obesity    Osteoarthritis    PONV (postoperative nausea and vomiting)    Sleep apnea     Past Surgical History:  Procedure Laterality Date   BARIATRIC SURGERY  BIOPSY  08/09/2020   Procedure: BIOPSY;  Surgeon: Lyndel Deward PARAS, MD;  Location: [facility];  Service: General;;   CARDIAC SURGERY  2005   stent   ESOPHAGOGASTRODUODENOSCOPY N/A 08/09/2020   Procedure: ESOPHAGOGASTRODUODENOSCOPY (EGD);  Surgeon: Lyndel Deward PARAS, MD;  Location: [facility];  Service: General;  Laterality: N/A;   TOTAL KNEE ARTHROPLASTY Right 04/25/2021   Procedure: RIGHT TOTAL  KNEE ARTHROPLASTY;  Surgeon: Harden Jerona GAILS, MD;  Location: [facility] OR;  Service: Orthopedics;  Laterality: Right;   UPPER GI ENDOSCOPY N/A 10/15/2020   Procedure: UPPER GI ENDOSCOPY;  Surgeon: Lyndel Deward PARAS, MD;  Location: [facility];  Service: General;  Laterality: N/A;    MEDICATIONS:  aspirin  EC 81 MG tablet   atorvastatin  (LIPITOR ) 80 MG tablet   CARTIA  XT 240 MG 24 hr capsule   doxycycline (VIBRAMYCIN) 50 MG capsule   ezetimibe  (ZETIA ) 10 MG tablet   lisinopril -hydrochlorothiazide  (ZESTORETIC ) 20-12.5 MG tablet   Multiple Vitamin (MULTI-VITAMIN) tablet   No current facility-administered medications for this encounter.   Doxycycline is as needed for rosacea flares.  Isaiah Ruder, PA-C Surgical Short Stay/Anesthesiology [facility] Phone [phone] [facility] Phone [phone] 01/11/2024 2:56 PM

## 2024-01-11 NOTE — Anesthesia Preprocedure Evaluation (Addendum)
 "                                  Anesthesia Evaluation  Patient identified by MRN, date of birth, ID band Patient awake    Reviewed: Allergy & Precautions, NPO status , Patient's Chart, lab work & pertinent test results  History of Anesthesia Complications (+) PONV and history of anesthetic complications  Airway Mallampati: I  TM Distance: >3 FB Neck ROM: Full    Dental  (+) Teeth Intact, Dental Advisory Given   Pulmonary neg shortness of breath, sleep apnea and Continuous Positive Airway Pressure Ventilation , neg COPD, neg recent URI   breath sounds clear to auscultation       Cardiovascular hypertension, Pt. on medications (-) angina + CAD and + Cardiac Stents  (-) Past MI (-) dysrhythmias  Rhythm:Regular     Neuro/Psych  PSYCHIATRIC DISORDERS Anxiety     negative neurological ROS     GI/Hepatic negative GI ROS, Neg liver ROS,,,  Endo/Other  negative endocrine ROS    Renal/GU      Musculoskeletal  (+) Arthritis ,    Abdominal   Peds  Hematology negative hematology ROS (+) Lab Results      Component                Value               Date                      WBC                      6.4                 01/10/2024                HGB                      15.4                01/10/2024                HCT                      45.7                01/10/2024                MCV                      96.8                01/10/2024                PLT                      210                 01/10/2024             Denies blood thinners   Anesthesia Other Findings   Reproductive/Obstetrics                              Anesthesia Physical Anesthesia Plan  ASA: 2  Anesthesia Plan: MAC, Regional and Spinal  Post-op Pain Management: Regional block*   Induction: Intravenous  PONV Risk Score and Plan: Ondansetron , Dexamethasone  and Propofol  infusion  Airway Management Planned: Nasal Cannula, Natural Airway and Simple  Face Mask  Additional Equipment:   Intra-op Plan:   Post-operative Plan:   Informed Consent: I have reviewed the patients History and Physical, chart, labs and discussed the procedure including the risks, benefits and alternatives for the proposed anesthesia with the patient or authorized representative who has indicated his/her understanding and acceptance.     Dental advisory given  Plan Discussed with: CRNA  Anesthesia Plan Comments: (PAT note written 01/11/2024 by Allison Zelenak, PA-C.  )         Anesthesia Quick Evaluation  "

## 2024-01-11 NOTE — H&P (Signed)
 TOTAL KNEE ADMISSION H&P  Patient is being admitted for left total knee arthroplasty.  Subjective:  Chief Complaint:left knee pain.  HPI: Nathan Pope, 64 y.o. male, has a history of pain and functional disability in the left knee due to arthritis and has failed non-surgical conservative treatments for greater than 12 weeks to includeNSAID's and/or analgesics, corticosteriod injections, flexibility and strengthening excercises, and supervised PT with diminished ADL's post treatment.  Onset of symptoms was gradual, starting 3 years ago with gradually worsening course since that time. The patient noted no past surgery on the left knee(s).  Patient currently rates pain in the left knee(s) at 8 out of 10 with activity. Patient has night pain, worsening of pain with activity and weight bearing, and pain that interferes with activities of daily living.  Patient has evidence of joint space narrowing by imaging studies. There is no active infection.  Patient Active Problem List   Diagnosis Date Noted   Total knee replacement status, right 04/25/2021   Unilateral primary osteoarthritis, right knee    Morbid obesity with BMI of 50.0-59.9, adult (HCC) 10/15/2020   HYPERLIPIDEMIA 09/05/2008   Essential hypertension 09/05/2008   Coronary atherosclerosis 09/05/2008   Cardiac dysrhythmia 09/05/2008   Past Medical History:  Diagnosis Date   Anxiety    Arthritis    Cancer (HCC)    melanoma removed (mole on stomach)   Coronary artery disease    Hypercholesterolemia    Hypertension    Obesity    Osteoarthritis    PONV (postoperative nausea and vomiting)    Sleep apnea     Past Surgical History:  Procedure Laterality Date   BARIATRIC SURGERY     BIOPSY  08/09/2020   Procedure: BIOPSY;  Surgeon: Lyndel Deward PARAS, MD;  Location: [facility];  Service: General;;   CARDIAC SURGERY  2005   stent   ESOPHAGOGASTRODUODENOSCOPY N/A 08/09/2020   Procedure: ESOPHAGOGASTRODUODENOSCOPY (EGD);   Surgeon: Lyndel Deward PARAS, MD;  Location: [facility];  Service: General;  Laterality: N/A;   TOTAL KNEE ARTHROPLASTY Right 04/25/2021   Procedure: RIGHT TOTAL KNEE ARTHROPLASTY;  Surgeon: Harden Jerona GAILS, MD;  Location: [facility] OR;  Service: Orthopedics;  Laterality: Right;   UPPER GI ENDOSCOPY N/A 10/15/2020   Procedure: UPPER GI ENDOSCOPY;  Surgeon: Lyndel Deward PARAS, MD;  Location: [facility];  Service: General;  Laterality: N/A;    No current facility-administered medications for this encounter.   Current Outpatient Medications  Medication Sig Dispense Refill Last Dose/Taking   atorvastatin  (LIPITOR ) 80 MG tablet Take 80 mg by mouth in the morning.  5 Taking   CARTIA  XT 240 MG 24 hr capsule Take 240 mg by mouth daily.  5 Taking   doxycycline (VIBRAMYCIN) 50 MG capsule Take 50 mg by mouth daily as needed (rosacea flare ups).   Taking As Needed   ezetimibe  (ZETIA ) 10 MG tablet Take 1 tablet (10 mg total) by mouth daily. 90 tablet 2 Taking   lisinopril -hydrochlorothiazide  (ZESTORETIC ) 20-12.5 MG tablet Take 1 tablet by mouth daily.   Taking   Multiple Vitamin (MULTI-VITAMIN) tablet Take 1 tablet by mouth daily.   Taking   aspirin  EC 81 MG tablet Take 81 mg by mouth in the morning.      Allergies[1]  Social History   Tobacco Use   Smoking status: Never   Smokeless tobacco: Never  Substance Use Topics   Alcohol  use: Not Currently    Family History  Problem Relation Age of Onset   Diabetes Father  Arthritis Father    Hypertension Father    Hypercalcemia Maternal Grandfather      Review of Systems  All other systems reviewed and are negative.   Objective:  Physical Exam  Left Knee Exam    Muscle Strength  The patient has normal left knee strength.   Tenderness  The patient is experiencing tenderness in the medial joint line.   Tests  Varus: negative Valgus: negative   Other  Erythema: absent Swelling: mild Effusion: no effusion present   Patient is alert,  oriented, no adenopathy, well-dressed, normal affect, normal respiratory effort.      Vital signs in last 24 hours: Temp:  [98.2 F (36.8 C)] 98.2 F (36.8 C) (01/05 0936) Pulse Rate:  [64] 64 (01/05 0936) Resp:  [18] 18 (01/05 0936) BP: (117)/(76) 117/76 (01/05 0936) SpO2:  [97 %] 97 % (01/05 0936) Weight:  [145.5 kg] 145.5 kg (01/05 0936)  Labs:   Estimated body mass index is 41.19 kg/m as calculated from the following:   Height as of 01/10/24: 6' 2 (1.88 m).   Weight as of 01/10/24: 145.5 kg.   Imaging Review Plain radiographs demonstrate moderate degenerative joint disease of the left knee(s). The overall alignment ismild varus. The bone quality appears to be good for age and reported activity level.      Assessment/Plan:  End stage arthritis, left knee   The patient history, physical examination, clinical judgment of the provider and imaging studies are consistent with end stage degenerative joint disease of the left knee(s) and total knee arthroplasty is deemed medically necessary. The treatment options including medical management, injection therapy arthroscopy and arthroplasty were discussed at length. The risks and benefits of total knee arthroplasty were presented and reviewed. The risks due to aseptic loosening, infection, stiffness, patella tracking problems, thromboembolic complications and other imponderables were discussed. The patient acknowledged the explanation, agreed to proceed with the plan and consent was signed. Patient is being admitted for inpatient treatment for surgery, pain control, PT, OT, prophylactic antibiotics, VTE prophylaxis, progressive ambulation and ADL's and discharge planning. The patient is planning to be discharged home with home health services     Patient's anticipated LOS is less than 2 midnights, meeting these requirements: - Younger than 59 - Lives within 1 hour of care - Has a competent adult at home to recover with post-op  recover - NO history of  - Chronic pain requiring opiods  - Diabetes  - Coronary Artery Disease  - Heart failure  - Heart attack  - Stroke  - DVT/VTE  - Cardiac arrhythmia  - Respiratory Failure/COPD  - Renal failure  - Anemia  - Advanced Liver disease      [1] No Known Allergies

## 2024-01-14 ENCOUNTER — Other Ambulatory Visit: Payer: Self-pay

## 2024-01-14 ENCOUNTER — Encounter (HOSPITAL_COMMUNITY): Admission: RE | Payer: Self-pay | Source: Home / Self Care

## 2024-01-14 ENCOUNTER — Ambulatory Visit (HOSPITAL_COMMUNITY): Admitting: Certified Registered Nurse Anesthetist

## 2024-01-14 ENCOUNTER — Ambulatory Visit (HOSPITAL_COMMUNITY)
Admission: RE | Admit: 2024-01-14 | Discharge: 2024-01-15 | Disposition: A | Attending: Orthopedic Surgery | Admitting: Orthopedic Surgery

## 2024-01-14 ENCOUNTER — Encounter (HOSPITAL_COMMUNITY): Payer: Self-pay | Admitting: Orthopedic Surgery

## 2024-01-14 ENCOUNTER — Ambulatory Visit (HOSPITAL_COMMUNITY): Payer: Self-pay | Admitting: Vascular Surgery

## 2024-01-14 DIAGNOSIS — Z8261 Family history of arthritis: Secondary | ICD-10-CM | POA: Insufficient documentation

## 2024-01-14 DIAGNOSIS — M1712 Unilateral primary osteoarthritis, left knee: Secondary | ICD-10-CM

## 2024-01-14 DIAGNOSIS — Z79899 Other long term (current) drug therapy: Secondary | ICD-10-CM | POA: Insufficient documentation

## 2024-01-14 DIAGNOSIS — Z8249 Family history of ischemic heart disease and other diseases of the circulatory system: Secondary | ICD-10-CM | POA: Insufficient documentation

## 2024-01-14 DIAGNOSIS — I2510 Atherosclerotic heart disease of native coronary artery without angina pectoris: Secondary | ICD-10-CM | POA: Diagnosis not present

## 2024-01-14 DIAGNOSIS — G4730 Sleep apnea, unspecified: Secondary | ICD-10-CM | POA: Insufficient documentation

## 2024-01-14 DIAGNOSIS — M25762 Osteophyte, left knee: Secondary | ICD-10-CM | POA: Insufficient documentation

## 2024-01-14 DIAGNOSIS — I10 Essential (primary) hypertension: Secondary | ICD-10-CM | POA: Insufficient documentation

## 2024-01-14 DIAGNOSIS — Z955 Presence of coronary angioplasty implant and graft: Secondary | ICD-10-CM | POA: Diagnosis not present

## 2024-01-14 HISTORY — PX: TOTAL KNEE ARTHROPLASTY: SHX125

## 2024-01-14 MED ORDER — OXYCODONE HCL 5 MG PO TABS
5.0000 mg | ORAL_TABLET | ORAL | Status: DC | PRN
  Administered 2024-01-14 – 2024-01-15 (×6): 5 mg via ORAL
  Filled 2024-01-14: qty 1
  Filled 2024-01-14: qty 2
  Filled 2024-01-14 (×3): qty 1

## 2024-01-14 MED ORDER — PHENOL 1.4 % MT LIQD: 1.0000 | OROMUCOSAL | Status: DC | PRN

## 2024-01-14 MED ORDER — DOCUSATE SODIUM 100 MG PO CAPS
100.0000 mg | ORAL_CAPSULE | Freq: Two times a day (BID) | ORAL | Status: DC
  Administered 2024-01-14 – 2024-01-15 (×3): 100 mg via ORAL
  Filled 2024-01-14 (×3): qty 1

## 2024-01-14 MED ORDER — DEXAMETHASONE SOD PHOSPHATE PF 10 MG/ML IJ SOLN
INTRAMUSCULAR | Status: DC | PRN
  Administered 2024-01-14: 5 mg via INTRAVENOUS

## 2024-01-14 MED ORDER — ORAL CARE MOUTH RINSE: 15.0000 mL | Freq: Once | OROMUCOSAL | Status: AC

## 2024-01-14 MED ORDER — MIDAZOLAM HCL (PF) 2 MG/2ML IJ SOLN
INTRAMUSCULAR | Status: DC | PRN
  Administered 2024-01-14: 2 mg via INTRAVENOUS

## 2024-01-14 MED ORDER — PROPOFOL 10 MG/ML IV BOLUS
INTRAVENOUS | Status: DC | PRN
  Administered 2024-01-14: 20 mg via INTRAVENOUS

## 2024-01-14 MED ORDER — BUPIVACAINE-EPINEPHRINE (PF) 0.5% -1:200000 IJ SOLN
INTRAMUSCULAR | Status: DC | PRN
  Administered 2024-01-14: 20 mL via PERINEURAL

## 2024-01-14 MED ORDER — LISINOPRIL 20 MG PO TABS
20.0000 mg | ORAL_TABLET | Freq: Every day | ORAL | Status: DC
  Administered 2024-01-15: 20 mg via ORAL
  Filled 2024-01-14: qty 1

## 2024-01-14 MED ORDER — PROPOFOL 500 MG/50ML IV EMUL
INTRAVENOUS | Status: DC | PRN
  Administered 2024-01-14: 125 ug/kg/min via INTRAVENOUS

## 2024-01-14 MED ORDER — LACTATED RINGERS IV SOLN: INTRAVENOUS | Status: DC

## 2024-01-14 MED ORDER — ONDANSETRON HCL 4 MG PO TABS: 4.0000 mg | ORAL_TABLET | Freq: Four times a day (QID) | ORAL | Status: DC | PRN

## 2024-01-14 MED ORDER — ONDANSETRON HCL 4 MG/2ML IJ SOLN: 4.0000 mg | Freq: Four times a day (QID) | INTRAMUSCULAR | Status: DC | PRN

## 2024-01-14 MED ORDER — FENTANYL CITRATE (PF) 100 MCG/2ML IJ SOLN
25.0000 ug | INTRAMUSCULAR | Status: DC | PRN
  Administered 2024-01-14 (×2): 25 ug via INTRAVENOUS

## 2024-01-14 MED ORDER — ATORVASTATIN CALCIUM 80 MG PO TABS
80.0000 mg | ORAL_TABLET | Freq: Every morning | ORAL | Status: DC
  Administered 2024-01-15: 80 mg via ORAL
  Filled 2024-01-14: qty 1

## 2024-01-14 MED ORDER — OXYCODONE HCL 5 MG PO TABS
ORAL_TABLET | ORAL | Status: AC
  Filled 2024-01-14: qty 1

## 2024-01-14 MED ORDER — MIDAZOLAM HCL 2 MG/2ML IJ SOLN
INTRAMUSCULAR | Status: AC
  Filled 2024-01-14: qty 2

## 2024-01-14 MED ORDER — SODIUM CHLORIDE 0.9 % IR SOLN
Status: DC | PRN
  Administered 2024-01-14: 3000 mL

## 2024-01-14 MED ORDER — ACETAMINOPHEN 325 MG PO TABS
325.0000 mg | ORAL_TABLET | Freq: Four times a day (QID) | ORAL | Status: DC | PRN
  Administered 2024-01-15: 650 mg via ORAL
  Filled 2024-01-14: qty 2

## 2024-01-14 MED ORDER — PROPOFOL 10 MG/ML IV BOLUS
INTRAVENOUS | Status: AC
  Filled 2024-01-14: qty 20

## 2024-01-14 MED ORDER — EZETIMIBE 10 MG PO TABS
10.0000 mg | ORAL_TABLET | Freq: Every day | ORAL | Status: DC
  Administered 2024-01-14 – 2024-01-15 (×2): 10 mg via ORAL
  Filled 2024-01-14 (×2): qty 1

## 2024-01-14 MED ORDER — BUPIVACAINE IN DEXTROSE 0.75-8.25 % IT SOLN
INTRATHECAL | Status: DC | PRN
  Administered 2024-01-14: 2 mL via INTRATHECAL

## 2024-01-14 MED ORDER — HYDROMORPHONE HCL 1 MG/ML IJ SOLN: 0.5000 mg | INTRAMUSCULAR | Status: DC | PRN

## 2024-01-14 MED ORDER — ACETAMINOPHEN 500 MG PO TABS
1000.0000 mg | ORAL_TABLET | Freq: Four times a day (QID) | ORAL | Status: DC
  Administered 2024-01-14 – 2024-01-15 (×3): 1000 mg via ORAL
  Filled 2024-01-14 (×3): qty 2

## 2024-01-14 MED ORDER — LISINOPRIL-HYDROCHLOROTHIAZIDE 20-12.5 MG PO TABS: 1.0000 | ORAL_TABLET | Freq: Every day | ORAL | Status: DC

## 2024-01-14 MED ORDER — PHENYLEPHRINE HCL-NACL 20-0.9 MG/250ML-% IV SOLN
INTRAVENOUS | Status: DC | PRN
  Administered 2024-01-14: 30 ug/min via INTRAVENOUS

## 2024-01-14 MED ORDER — OXYCODONE HCL 5 MG PO TABS: 5.0000 mg | ORAL_TABLET | Freq: Once | ORAL | Status: DC | PRN

## 2024-01-14 MED ORDER — FENTANYL CITRATE (PF) 250 MCG/5ML IJ SOLN
INTRAMUSCULAR | Status: AC
  Filled 2024-01-14: qty 5

## 2024-01-14 MED ORDER — VASHE WOUND IRRIGATION OPTIME
TOPICAL | Status: DC | PRN
  Administered 2024-01-14 (×2): 34 [oz_av]

## 2024-01-14 MED ORDER — MENTHOL 3 MG MT LOZG: 1.0000 | LOZENGE | OROMUCOSAL | Status: DC | PRN

## 2024-01-14 MED ORDER — CHLORHEXIDINE GLUCONATE 0.12 % MT SOLN
15.0000 mL | Freq: Once | OROMUCOSAL | Status: AC
  Administered 2024-01-14: 15 mL via OROMUCOSAL
  Filled 2024-01-14: qty 15

## 2024-01-14 MED ORDER — ASPIRIN 81 MG PO CHEW
81.0000 mg | CHEWABLE_TABLET | Freq: Two times a day (BID) | ORAL | Status: DC
  Administered 2024-01-14 – 2024-01-15 (×2): 81 mg via ORAL
  Filled 2024-01-14 (×2): qty 1

## 2024-01-14 MED ORDER — SODIUM CHLORIDE 0.9 % IV SOLN: INTRAVENOUS | Status: DC

## 2024-01-14 MED ORDER — ONDANSETRON HCL 4 MG/2ML IJ SOLN
INTRAMUSCULAR | Status: DC | PRN
  Administered 2024-01-14: 4 mg via INTRAVENOUS

## 2024-01-14 MED ORDER — FENTANYL CITRATE (PF) 100 MCG/2ML IJ SOLN
INTRAMUSCULAR | Status: AC
  Filled 2024-01-14: qty 2

## 2024-01-14 MED ORDER — ACETAMINOPHEN 10 MG/ML IV SOLN
1000.0000 mg | Freq: Once | INTRAVENOUS | Status: DC | PRN
  Administered 2024-01-14: 1000 mg via INTRAVENOUS

## 2024-01-14 MED ORDER — HYDROCHLOROTHIAZIDE 12.5 MG PO TABS
12.5000 mg | ORAL_TABLET | Freq: Every day | ORAL | Status: DC
  Administered 2024-01-15: 12.5 mg via ORAL
  Filled 2024-01-14: qty 1

## 2024-01-14 MED ORDER — TRANEXAMIC ACID-NACL 1000-0.7 MG/100ML-% IV SOLN
INTRAVENOUS | Status: DC | PRN
  Administered 2024-01-14: 1000 mg via INTRAVENOUS

## 2024-01-14 MED ORDER — METOCLOPRAMIDE HCL 5 MG/ML IJ SOLN: 5.0000 mg | Freq: Three times a day (TID) | INTRAMUSCULAR | Status: DC | PRN

## 2024-01-14 MED ORDER — OXYCODONE HCL 5 MG/5ML PO SOLN: 5.0000 mg | Freq: Once | ORAL | Status: DC | PRN

## 2024-01-14 MED ORDER — CEFAZOLIN SODIUM-DEXTROSE 3-4 GM/150ML-% IV SOLN
3.0000 g | INTRAVENOUS | Status: AC
  Administered 2024-01-14: 3 g via INTRAVENOUS
  Filled 2024-01-14: qty 150

## 2024-01-14 MED ORDER — ACETAMINOPHEN 10 MG/ML IV SOLN
INTRAVENOUS | Status: AC
  Filled 2024-01-14: qty 100

## 2024-01-14 MED ORDER — DILTIAZEM HCL ER COATED BEADS 240 MG PO CP24
240.0000 mg | ORAL_CAPSULE | Freq: Every day | ORAL | Status: DC
  Administered 2024-01-15: 240 mg via ORAL
  Filled 2024-01-14 (×2): qty 1

## 2024-01-14 MED ORDER — METOCLOPRAMIDE HCL 5 MG PO TABS: 5.0000 mg | ORAL_TABLET | Freq: Three times a day (TID) | ORAL | Status: DC | PRN

## 2024-01-14 MED ORDER — SCOPOLAMINE 1 MG/3DAYS TD PT72
MEDICATED_PATCH | TRANSDERMAL | Status: DC | PRN
  Administered 2024-01-14: 1 via TRANSDERMAL

## 2024-01-14 MED ORDER — OXYCODONE HCL 5 MG PO TABS: 10.0000 mg | ORAL_TABLET | ORAL | Status: DC | PRN

## 2024-01-14 MED ORDER — STERILE WATER FOR IRRIGATION IR SOLN
Status: DC | PRN
  Administered 2024-01-14: 1000 mL

## 2024-01-14 NOTE — Anesthesia Procedure Notes (Signed)
 Spinal  Patient location during procedure: OR Start time: 01/14/2024 7:32 AM End time: 01/14/2024 7:36 AM Reason for block: surgical anesthesia  Staffing Performed: anesthesiologist  Authorized by: Leopoldo Bruckner, MD   Performed by: Leopoldo Bruckner, MD  Preanesthetic Checklist Completed: patient identified, IV checked, risks and benefits discussed, surgical consent, monitors and equipment checked, pre-op evaluation and timeout performed Spinal Block Patient position: sitting Prep: ChloraPrep Patient monitoring: heart rate, cardiac monitor, continuous pulse ox and blood pressure Approach: midline Location: L4-5 Injection technique: single-shot Needle Needle type: Pencan  Needle gauge: 24 G Needle length: 9 cm Assessment Events: CSF return

## 2024-01-14 NOTE — Interval H&P Note (Signed)
 History and Physical Interval Note:  01/14/2024 6:51 AM  Nathan Pope  has presented today for surgery, with the diagnosis of Osteoarthritis Left Knee.  The various methods of treatment have been discussed with the patient and family. After consideration of risks, benefits and other options for treatment, the patient has consented to  Procedures: ARTHROPLASTY, KNEE, TOTAL (Left) as a surgical intervention.  The patient's history has been reviewed, patient examined, no change in status, stable for surgery.  I have reviewed the patient's chart and labs.  Questions were answered to the patient's satisfaction.     Odis Turck V Ibn Stief

## 2024-01-14 NOTE — Evaluation (Signed)
 Physical Therapy Evaluation Patient Details Name: Nathan Pope MRN: [id number] DOB: 1960/07/16 Today's Date: 01/14/2024  History of Present Illness  64 y.o. male presents to [facility] hospital on 01/14/2024 for elective L TKA. PMH includes HLD, HTN, CAD, R TKA.  Clinical Impression  Pt presents to PT with deficits in functional mobility, gait, balance, strength, ROM. Pt is able to ambulate for household distances with support of the RW. PT provides education on the TKR exercise packet and encourages frequent mobilization with staff assistance. PT will follow up tomorrow for a progression of gait and to initiate stair training.        If plan is discharge home, recommend the following: A little help with bathing/dressing/bathroom;Assistance with cooking/housework;Assist for transportation;Help with stairs or ramp for entrance   Can travel by private vehicle        Equipment Recommendations None recommended by PT  Recommendations for Other Services       Functional Status Assessment Patient has had a recent decline in their functional status and demonstrates the ability to make significant improvements in function in a reasonable and predictable amount of time.     Precautions / Restrictions Precautions Precautions: Knee;Fall Precaution Booklet Issued: Yes (comment) Recall of Precautions/Restrictions: Intact Restrictions Weight Bearing Restrictions Per Provider Order: Yes LLE Weight Bearing Per Provider Order: Weight bearing as tolerated      Mobility  Bed Mobility Overal bed mobility: Needs Assistance Bed Mobility: Supine to Sit, Sit to Supine     Supine to sit: Supervision Sit to supine: Supervision        Transfers Overall transfer level: Needs assistance Equipment used: Rolling walker (2 wheels) Transfers: Sit to/from Stand Sit to Stand: Contact guard assist                Ambulation/Gait Ambulation/Gait assistance: Contact guard assist Gait Distance (Feet):  100 Feet Assistive device: Rolling walker (2 wheels) Gait Pattern/deviations: Step-through pattern Gait velocity: reduced Gait velocity interpretation: <1.8 ft/sec, indicate of risk for recurrent falls   General Gait Details: slowed step-through gait, reduced stance time on LLE  Stairs            Wheelchair Mobility     Tilt Bed    Modified Rankin (Stroke Patients Only)       Balance Overall balance assessment: Needs assistance Sitting-balance support: No upper extremity supported, Feet supported Sitting balance-Leahy Scale: Good     Standing balance support: Single extremity supported, Reliant on assistive device for balance Standing balance-Leahy Scale: Poor                               Pertinent Vitals/Pain Pain Assessment Pain Assessment: Faces Faces Pain Scale: Hurts even more Pain Location: L knee Pain Descriptors / Indicators: Aching Pain Intervention(s): Monitored during session    Home Living Family/patient expects to be discharged to:: Private residence Living Arrangements: Spouse/significant other Available Help at Discharge: Family;Available 24 hours/day Type of Home: House Home Access: Stairs to enter       Home Layout: Bed/bath upstairs Home Equipment: Agricultural Consultant (2 wheels);Standard Walker;Cane - single point;Toilet riser;Tub bench      Prior Function Prior Level of Function : Independent/Modified Independent;Driving             Mobility Comments: ambulatory without DME       Extremity/Trunk Assessment   Upper Extremity Assessment Upper Extremity Assessment: Overall WFL for tasks assessed    Lower Extremity  Assessment Lower Extremity Assessment: LLE deficits/detail LLE Deficits / Details: generalized post-op weakness and ROM deficits as anticipated on POD 0    Cervical / Trunk Assessment Cervical / Trunk Assessment: Normal  Communication   Communication Communication: No apparent difficulties     Cognition Arousal: Alert Behavior During Therapy: WFL for tasks assessed/performed   PT - Cognitive impairments: No apparent impairments                         Following commands: Intact       Cueing Cueing Techniques: Verbal cues     General Comments General comments (skin integrity, edema, etc.): VSS on RA, pt does become diaphoretic after completing bout of ambulation and sitting at bedside, BP 95/74 in supine    Exercises Other Exercises Other Exercises: PT provides education on TKR exercise packet   Assessment/Plan    PT Assessment Patient needs continued PT services  PT Problem List Decreased strength;Decreased range of motion;Decreased activity tolerance;Decreased balance;Decreased mobility;Decreased knowledge of use of DME;Pain       PT Treatment Interventions DME instruction;Gait training;Stair training;Functional mobility training;Therapeutic activities;Therapeutic exercise;Balance training;Neuromuscular re-education;Patient/family education    PT Goals (Current goals can be found in the Care Plan section)  Acute Rehab PT Goals Patient Stated Goal: to return to independence PT Goal Formulation: With patient Time For Goal Achievement: 01/18/24 Potential to Achieve Goals: Good    Frequency 7X/week     Co-evaluation               AM-PAC PT 6 Clicks Mobility  Outcome Measure Help needed turning from your back to your side while in a flat bed without using bedrails?: A Little Help needed moving from lying on your back to sitting on the side of a flat bed without using bedrails?: A Little Help needed moving to and from a bed to a chair (including a wheelchair)?: A Little Help needed standing up from a chair using your arms (e.g., wheelchair or bedside chair)?: A Little Help needed to walk in hospital room?: A Little Help needed climbing 3-5 steps with a railing? : A Lot 6 Click Score: 17    End of Session Equipment Utilized During  Treatment: Gait belt Activity Tolerance: Patient tolerated treatment well Patient left: in bed;with call bell/phone within reach Nurse Communication: Mobility status PT Visit Diagnosis: Other abnormalities of gait and mobility (R26.89);Muscle weakness (generalized) (M62.81);Pain Pain - Right/Left: Left Pain - part of body: Knee    Time: 1610-1640 PT Time Calculation (min) (ACUTE ONLY): 30 min   Charges:   PT Evaluation $PT Eval Low Complexity: 1 Low   PT General Charges $$ ACUTE PT VISIT: 1 Visit         Nathan Pope, PT, DPT Acute Rehabilitation Office [phone]   Nathan Pope 01/14/2024, 4:52 PM

## 2024-01-14 NOTE — Transfer of Care (Signed)
 Immediate Anesthesia Transfer of Care Note  Patient: Nathan Pope  Procedure(s) Performed: ARTHROPLASTY, KNEE, TOTAL (Left: Knee)  Patient Location: PACU  Anesthesia Type:Regional and Spinal  Level of Consciousness: drowsy and patient cooperative  Airway & Oxygen Therapy: Patient Spontanous Breathing and Patient connected to nasal cannula oxygen  Post-op Assessment: Report given to RN, Post -op Vital signs reviewed and stable, and Patient moving all extremities X 4  Post vital signs: Reviewed and stable  Last Vitals:  Vitals Value Taken Time  BP 94/66 01/14/24 09:11  Temp    Pulse 68 01/14/24 09:13  Resp 14   SpO2 93 % 01/14/24 09:13  Vitals shown include unfiled device data.  Last Pain:  Vitals:   01/14/24 0610  TempSrc:   PainSc: 0-No pain         Complications: No notable events documented.

## 2024-01-14 NOTE — Op Note (Signed)
 DATE OF SURGERY:  01/14/2024  TIME: 9:06 AM  PATIENT NAME:  Nathan Pope    AGE: 64 y.o.    PRE-OPERATIVE DIAGNOSIS:  Osteoarthritis Left Knee  POST-OPERATIVE DIAGNOSIS:  Osteoarthritis Left Knee  PROCEDURE:  Procedures: ARTHROPLASTY, KNEE, TOTAL  SURGEON: Jerona Sage  ASSISTANT: Magdalene Blade  OPERATIVE IMPLANTS: Zimmer Persona  Femur size 12, Tibia size H  2- Peg fixed bearing, Patella size 35 1-peg oval button, with a 10 mm polyethylene insert medial congruent.  @ENCIMAGES @  Implant Name Type Inv. Item Serial No. Manufacturer Lot No. LRB No. Used Action  COMPONENT PATELLA 3 PEG 35 - [id number] Joint COMPONENT PATELLA 3 PEG 35  ZIMMER RECON(ORTH,TRAU,BIO,SG) [id number] Left 1 Implanted  LINER TIB ASF GH/12 10 LT - [id number] Liner LINER TIB ASF GH/12 10 LT  ZIMMER RECON(ORTH,TRAU,BIO,SG) [id number] Left 1 Implanted  COMPONET TIB TM PS H LT - [id number] Joint COMPONET TIB TM PS H LT  ZIMMER RECON(ORTH,TRAU,BIO,SG) [id number] Left 1 Implanted  COMPONENT FEM CMT STD  SZ12LT - [id number] Joint COMPONENT FEM CMT STD  [id number]  ZIMMER RECON(ORTH,TRAU,BIO,SG) [id number] Left 1 Implanted  CEMENT BONE R 1X40 - [id number] Cement CEMENT BONE R 1X40  ZIMMER RECON(ORTH,TRAU,BIO,SG) [id number] Left 1 Implanted      PREOPERATIVE INDICATIONS:   Nathan Pope is a 64 y.o. year old male with end stage degenerative arthritis of the knee who failed conservative treatment and elected for Total Knee Arthroplasty.   The risks, benefits, and alternatives were discussed at length including but not limited to the risks of infection, bleeding, nerve injury, stiffness, blood clots, the need for revision surgery, cardiopulmonary complications, among others, and they were willing to proceed.  OPERATIVE DESCRIPTION:  The patient was brought to the operative room and placed in a supine position.  Anesthesia was administered.  IV antibiotics were given.  IV TXA was initiated.  The lower extremity was prepped and  draped in the usual sterile fashion.  Ioban was used to cover all exposed skin. Time out was performed.    Anterior quadriceps tendon splitting approach was performed.  The patella was everted and osteophytes were removed.  The anterior horn of the medial and lateral meniscus was removed.   The distal femur was opened with the drill and the intramedullary distal femoral cutting jig was utilized, set at 5 degrees valgus resecting 9 mm off the distal femur.  Care was taken to protect the collateral ligaments.  Then the extramedullary tibial cutting jig was utilized set for 3 degree posterior slope.  Care was taken during the cut to protect the medial and collateral ligaments.  The proximal tibia was removed along with the posterior horns of the menisci.  The PCL was sacrificed.    The extensor gap was measured and was approximately 10 mm.    The distal femoral sizing jig was applied, taking care to avoid notching.  Then the 4-in-1 cutting jig was applied and the anterior and posterior femur was cut, along with the chamfer cuts.  All posterior osteophytes were removed.  The flexion gap was then measured and was symmetric with the extension gap.  The distal femoral preparation using the appropriate jig to prepare the box.  The patella was then measured, and cut with the saw.    The proximal tibia sized and prepared accordingly with the reamer and the punch, and then all components were trialed with the poly insert.  The knee was found to have stable balance and full motion.  The knee  was irrigated with normal saline, topical 2 g TXA was used to soak the wound.  The tibial component was then press fit into place.  The final polyethylene component was placed.  The femur component was then cemented in place.  The knee was then taken through a range of motion and the patella tracked well and the knee irrigated copiously and the parapatellar and subcutaneous tissue closed with vicryl, and skin closed with  staples..  A sterile dressing was applied and patient  was taken to the PACU in stable  condition.  There were no complications.  The knee was irrigated with Vashe throughout the surgery.  Total tourniquet time was 30 minutes.

## 2024-01-14 NOTE — Anesthesia Procedure Notes (Signed)
 Anesthesia Regional Block: Adductor canal block   Pre-Anesthetic Checklist: , timeout performed,  Correct Patient, Correct Site, Correct Laterality,  Correct Procedure, Correct Position, site marked,  Risks and benefits discussed,  Surgical consent,  Pre-op evaluation,  At surgeon's request and post-op pain management  Laterality: Left and Lower  Prep: chloraprep       Needles:  Injection technique: Single-shot      Needle Length: 9cm  Needle Gauge: 22     Additional Needles: Arrow StimuQuik ECHO Echogenic Stimulating PNB Needle  Procedures:,,,, ultrasound used (permanent image in chart),,    Narrative:  Start time: 01/14/2024 7:39 AM End time: 01/14/2024 7:42 AM Injection made incrementally with aspirations every 5 mL.  Performed by: Personally  Anesthesiologist: Leopoldo Bruckner, MD

## 2024-01-15 DIAGNOSIS — M1712 Unilateral primary osteoarthritis, left knee: Secondary | ICD-10-CM | POA: Diagnosis not present

## 2024-01-15 MED ORDER — OXYCODONE-ACETAMINOPHEN 5-325 MG PO TABS: 1.0000 | ORAL_TABLET | ORAL | 0 refills | Status: DC | PRN

## 2024-01-15 NOTE — Discharge Summary (Signed)
 " Physician Discharge Summary  Patient ID: ZALAN SHIDLER MRN: [id number] DOB/AGE: Mar 10, 1960 64 y.o.  Admit date: 01/14/2024 Discharge date: 01/15/2024  Admission Diagnoses:  Principal Problem:   Osteoarthritis of left knee, unspecified osteoarthritis type Active Problems:   Arthritis of left knee   Discharge Diagnoses:  Same  Past Medical History:  Diagnosis Date   Anxiety    Arthritis    Cancer (HCC)    melanoma removed (mole on stomach)   Coronary artery disease    Hypercholesterolemia    Hypertension    Obesity    Osteoarthritis    PONV (postoperative nausea and vomiting)    Sleep apnea     Surgeries: Procedures: ARTHROPLASTY, KNEE, TOTAL on 01/14/2024   Consultants:   Discharged Condition: Improved  Hospital Course: AZREAL STTHOMAS is an 64 y.o. male who was admitted 01/14/2024 with a chief complaint of No chief complaint on file. , and found to have a diagnosis of Osteoarthritis of left knee, unspecified osteoarthritis type.  They were brought to the operating room on 01/14/2024 and underwent the above named procedures.    They were given perioperative antibiotics:  Anti-infectives (From admission, onward)    Start     Dose/Rate Route Frequency Ordered Stop   01/14/24 0600  ceFAZolin  (ANCEF ) IVPB 3g/150 mL premix        3 g 300 mL/hr over 30 Minutes Intravenous On call to O.R. 01/14/24 9450 01/14/24 9266     .  They were given compression stockings, early ambulation, and chemoprophylaxis for DVT prophylaxis.  They benefited maximally from their hospital stay and there were no complications.    Recent vital signs:  Vitals:   01/15/24 0459 01/15/24 0800  BP: 106/68 114/72  Pulse: 63 66  Resp: 18 18  Temp: 97.7 F (36.5 C) 98 F (36.7 C)  SpO2: 96% 97%    Recent laboratory studies:  Results for orders placed or performed during the hospital encounter of 01/10/24  Surgical pcr screen   Collection Time: 01/10/24  9:50 AM   Specimen: Nasal Mucosa;  Nasal Swab  Result Value Ref Range   MRSA, PCR NEGATIVE NEGATIVE   Staphylococcus aureus NEGATIVE NEGATIVE  Basic metabolic panel per protocol   Collection Time: 01/10/24 10:20 AM  Result Value Ref Range   Sodium 139 135 - 145 mmol/L   Potassium 4.6 3.5 - 5.1 mmol/L   Chloride 101 98 - 111 mmol/L   CO2 30 22 - 32 mmol/L   Glucose, Bld 93 70 - 99 mg/dL   BUN 16 8 - 23 mg/dL   Creatinine, Ser 9.15 0.61 - 1.24 mg/dL   Calcium  8.9 8.9 - 10.3 mg/dL   GFR, Estimated >39 >39 mL/min   Anion gap 7 5 - 15  CBC per protocol   Collection Time: 01/10/24 10:20 AM  Result Value Ref Range   WBC 6.4 4.0 - 10.5 K/uL   RBC 4.72 4.22 - 5.81 MIL/uL   Hemoglobin 15.4 13.0 - 17.0 g/dL   HCT 54.2 60.9 - 47.9 %   MCV 96.8 80.0 - 100.0 fL   MCH 32.6 26.0 - 34.0 pg   MCHC 33.7 30.0 - 36.0 g/dL   RDW 87.0 88.4 - 84.4 %   Platelets 210 150 - 400 K/uL   nRBC 0.0 0.0 - 0.2 %    Discharge Medications:   Allergies as of 01/15/2024   No Known Allergies      Medication List     TAKE these medications  acetaminophen  500 MG tablet Commonly known as: TYLENOL  Take 1,000 mg by mouth every 6 (six) hours as needed.   aspirin  EC 81 MG tablet Take 81 mg by mouth in the morning.   atorvastatin  80 MG tablet Commonly known as: LIPITOR  Take 80 mg by mouth in the morning.   Cartia  XT 240 MG 24 hr capsule Generic drug: diltiazem  Take 240 mg by mouth daily.   doxycycline 50 MG capsule Commonly known as: VIBRAMYCIN Take 50 mg by mouth daily as needed (rosacea flare ups).   ezetimibe  10 MG tablet Commonly known as: ZETIA  Take 1 tablet (10 mg total) by mouth daily.   lisinopril -hydrochlorothiazide  20-12.5 MG tablet Commonly known as: ZESTORETIC  Take 1 tablet by mouth daily.   Multi-Vitamin tablet Take 1 tablet by mouth daily.   oxyCODONE -acetaminophen  5-325 MG tablet Commonly known as: PERCOCET/ROXICET Take 1 tablet by mouth every 4 (four) hours as needed.        Diagnostic Studies: No  results found.  Disposition: Discharge disposition: 01-Home or Self Care       Discharge Instructions     Call MD / Call 911   Complete by: As directed    If you experience chest pain or shortness of breath, CALL 911 and be transported to the hospital emergency room.  If you develope a fever above 101 F, pus (white drainage) or increased drainage or redness at the wound, or calf pain, call your surgeon's office.   Constipation Prevention   Complete by: As directed    Drink plenty of fluids.  Prune juice may be helpful.  You may use a stool softener, such as Colace (over the counter) 100 mg twice a day.  Use MiraLax  (over the counter) for constipation as needed.   Increase activity slowly as tolerated   Complete by: As directed    Post-operative opioid taper instructions:   Complete by: As directed    POST-OPERATIVE OPIOID TAPER INSTRUCTIONS: It is important to wean off of your opioid medication as soon as possible. If you do not need pain medication after your surgery it is ok to stop day one. Opioids include: Codeine, Hydrocodone(Norco, Vicodin), Oxycodone (Percocet, oxycontin ) and hydromorphone  amongst others.  Long term and even short term use of opiods can cause: Increased pain response Dependence Constipation Depression Respiratory depression And more.  Withdrawal symptoms can include Flu like symptoms Nausea, vomiting And more Techniques to manage these symptoms Hydrate well Eat regular healthy meals Stay active Use relaxation techniques(deep breathing, meditating, yoga) Do Not substitute Alcohol  to help with tapering If you have been on opioids for less than two weeks and do not have pain than it is ok to stop all together.  Plan to wean off of opioids This plan should start within one week post op of your joint replacement. Maintain the same interval or time between taking each dose and first decrease the dose.  Cut the total daily intake of opioids by one tablet  each day Next start to increase the time between doses. The last dose that should be eliminated is the evening dose.           Follow-up Information     Harden Jerona GAILS, MD Follow up in 1 week(s).   Specialty: Orthopedic Surgery Contact information: [address] [place] [address] [phone]                  Signed: Jerona GAILS Harden 01/15/2024, 9:05 AM  "

## 2024-01-15 NOTE — Progress Notes (Signed)
 Patient ID: Nathan Pope, male   DOB: Apr 26, 1960, 64 y.o.   MRN: [id number] Patient is postop day 1 left total knee arthroplasty.  Patient states he is doing better than he did with his previous total knee arthroplasty.  Plan for discharge today he will start physical therapy at home on Monday prescription for Percocet sent to his pharmacy

## 2024-01-15 NOTE — Progress Notes (Signed)
 Physical Therapy Treatment Patient Details Name: Nathan Pope MRN: [id number] DOB: 28-Oct-1960 Today's Date: 01/15/2024   History of Present Illness 64 y.o. male presents to [facility] hospital on 01/14/2024 for elective L TKA. PMH includes HLD, HTN, CAD, R TKA.    PT Comments  Pt doing well and able to mobilize adequately to return home. Pt well versed  in post op course after having rt knee done 3 years ago. Pt feels comfortable with technique for stairs and did not feel he needed to practice that prior to dc. Pt ready for dc home from PT standpoint.     If plan is discharge home, recommend the following: A little help with bathing/dressing/bathroom;Assistance with cooking/housework;Assist for transportation;Help with stairs or ramp for entrance   Can travel by private vehicle        Equipment Recommendations  None recommended by PT    Recommendations for Other Services       Precautions / Restrictions Precautions Precautions: Knee Recall of Precautions/Restrictions: Intact Restrictions Weight Bearing Restrictions Per Provider Order: Yes LLE Weight Bearing Per Provider Order: Weight bearing as tolerated     Mobility  Bed Mobility Overal bed mobility: Modified Independent Bed Mobility: Supine to Sit     Supine to sit: Modified independent (Device/Increase time)     General bed mobility comments: Incr time and used UE's to assist LLE off of bed    Transfers Overall transfer level: Modified independent Equipment used: Rolling walker (2 wheels) Transfers: Sit to/from Stand Sit to Stand: Modified independent (Device/Increase time)                Ambulation/Gait Ambulation/Gait assistance: Modified independent (Device/Increase time) Gait Distance (Feet): 120 Feet Assistive device: Rolling walker (2 wheels) Gait Pattern/deviations: Step-through pattern Gait velocity: decr Gait velocity interpretation: 1.31 - 2.62 ft/sec, indicative of limited community ambulator    General Gait Details: Steady gait with walker   Stairs             Wheelchair Mobility     Tilt Bed    Modified Rankin (Stroke Patients Only)       Balance Overall balance assessment: No apparent balance deficits (not formally assessed)                                          Communication Communication Communication: No apparent difficulties  Cognition Arousal: Alert Behavior During Therapy: WFL for tasks assessed/performed   PT - Cognitive impairments: No apparent impairments                         Following commands: Intact      Cueing Cueing Techniques: Verbal cues  Exercises Total Joint Exercises Quad Sets: AROM, Left, 10 reps, Supine Long Arc Quad: AAROM, Left, 5 reps, Seated Knee Flexion: AAROM, Left, 5 reps, Seated Goniometric ROM: 5-85    General Comments        Pertinent Vitals/Pain Pain Assessment Pain Assessment: Faces Faces Pain Scale: Hurts even more Pain Location: L knee Pain Descriptors / Indicators: Aching, Tender Pain Intervention(s): Limited activity within patient's tolerance, Monitored during session    Home Living                          Prior Function            PT Goals (current goals  can now be found in the care plan section) Acute Rehab PT Goals Patient Stated Goal: to return to independence Progress towards PT goals: Progressing toward goals    Frequency    7X/week      PT Plan      Co-evaluation              AM-PAC PT 6 Clicks Mobility   Outcome Measure  Help needed turning from your back to your side while in a flat bed without using bedrails?: None Help needed moving from lying on your back to sitting on the side of a flat bed without using bedrails?: None Help needed moving to and from a bed to a chair (including a wheelchair)?: None Help needed standing up from a chair using your arms (e.g., wheelchair or bedside chair)?: None Help needed to walk  in hospital room?: None Help needed climbing 3-5 steps with a railing? : A Little 6 Click Score: 23    End of Session   Activity Tolerance: Patient tolerated treatment well Patient left: in bed;with call bell/phone within reach;with family/visitor present (sitting EOB) Nurse Communication: Mobility status PT Visit Diagnosis: Other abnormalities of gait and mobility (R26.89);Muscle weakness (generalized) (M62.81);Pain Pain - Right/Left: Left Pain - part of body: Knee     Time: 0912-0934 PT Time Calculation (min) (ACUTE ONLY): 22 min  Charges:    $Gait Training: 8-22 mins PT General Charges $$ ACUTE PT VISIT: 1 Visit                     [facility] Acute Rehabilitation Services Office [phone]    [facility]gers ORN [facility] 01/15/2024, 9:44 AM

## 2024-01-15 NOTE — Anesthesia Postprocedure Evaluation (Signed)
"   Anesthesia Post Note  Patient: Nathan Pope  Procedure(s) Performed: ARTHROPLASTY, KNEE, TOTAL (Left: Knee)     Patient location during evaluation: PACU Anesthesia Type: Regional, MAC and Spinal Level of consciousness: awake and alert Pain management: pain level controlled Vital Signs Assessment: post-procedure vital signs reviewed and stable Respiratory status: spontaneous breathing, nonlabored ventilation and respiratory function stable Cardiovascular status: blood pressure returned to baseline and stable Postop Assessment: no apparent nausea or vomiting Anesthetic complications: no   No notable events documented.                  Daziyah Cogan      "

## 2024-01-15 NOTE — TOC Transition Note (Signed)
 Transition of Care [facility]) - Discharge Note   Patient Details  Name: Nathan Pope MRN: [id number] Date of Birth: [birth year]  Transition of [facility] CM/SW Contact:  Marval Gell, RN Phone Number: 01/15/2024, 9:12 AM   Clinical Narrative:     Per email from Tylene Ned RN CM, [facility] office, patient is bundle, and she is waiting to hear back from [facility].  No ICM needs identified for DC.         Patient Goals and CMS Choice            Discharge Placement                       Discharge Plan and Services Additional resources added to the After Visit Summary for                                       Social Drivers of Health (SDOH) Interventions SDOH Screenings   Food Insecurity: No Food Insecurity (01/14/2024)  Housing: Low Risk (01/14/2024)  Transportation Needs: No Transportation Needs (01/14/2024)  Utilities: Not At Risk (01/14/2024)  Tobacco Use: Low Risk (01/14/2024)     Readmission Risk Interventions     No data to display

## 2024-01-15 NOTE — Discharge Instructions (Signed)

## 2024-01-17 ENCOUNTER — Ambulatory Visit: Admitting: Physician Assistant

## 2024-01-17 ENCOUNTER — Telehealth: Payer: Self-pay | Admitting: *Deleted

## 2024-01-17 ENCOUNTER — Encounter (HOSPITAL_COMMUNITY): Payer: Self-pay | Admitting: Orthopedic Surgery

## 2024-01-17 DIAGNOSIS — Z96652 Presence of left artificial knee joint: Secondary | ICD-10-CM

## 2024-01-17 NOTE — Progress Notes (Deleted)
 "  Office Visit Note   Patient: Nathan Pope           Date of Birth: 1960-12-22           MRN: [id number] Visit Date: 01/17/2024              Requested by: Toribio Jerel MATSU, MD [address] [place],  [place] [address] PCP: Toribio Jerel MATSU, MD  Chief Complaint  Patient presents with   Left Knee - Routine Post Op    01/14/2024 left total knee replacement       HPI: ***  Assessment & Plan: Visit Diagnoses: No diagnosis found.  Plan: ***  Follow-Up Instructions: No follow-ups on file.   Ortho Exam  Patient is alert, oriented, no adenopathy, well-dressed, normal affect, normal respiratory effort. ***    Imaging: No results found. No images are attached to the encounter.  Labs: No results found for: HGBA1C, ESRSEDRATE, CRP, LABURIC, REPTSTATUS, GRAMSTAIN, CULT, LABORGA   Lab Results  Component Value Date   ALBUMIN 4.3 10/15/2020    No results found for: MG No results found for: VD25OH  No results found for: PREALBUMIN    Latest Ref Rng & Units 01/10/2024   10:20 AM 04/16/2021    9:40 AM 10/16/2020    4:28 AM  CBC EXTENDED  WBC 4.0 - 10.5 K/uL 6.4  5.7  12.0   RBC 4.22 - 5.81 MIL/uL 4.72  4.34  4.27   Hemoglobin 13.0 - 17.0 g/dL 84.5  86.1  86.2   HCT 39.0 - 52.0 % 45.7  41.8  39.4   Platelets 150 - 400 K/uL 210  221  PLATELET CLUMPS NOTED ON SMEAR, COUNT APPEARS DECREASED   NEUT# 1.7 - 7.7 K/uL   10.4   Lymph# 0.7 - 4.0 K/uL   0.9      There is no height or weight on file to calculate BMI.  Orders:  No orders of the defined types were placed in this encounter.  No orders of the defined types were placed in this encounter.    Procedures: No procedures performed  Clinical Data: No additional findings.  ROS:  All other systems negative, except as noted in the HPI. Review of Systems  Objective: Vital Signs: There were no vitals taken for this visit.  Specialty Comments:  No specialty comments available.  PMFS  History: Patient Active Problem List   Diagnosis Date Noted   Arthritis of left knee 01/14/2024   Osteoarthritis of left knee, unspecified osteoarthritis type 01/14/2024   Total knee replacement status, right 04/25/2021   Unilateral primary osteoarthritis, right knee    Morbid obesity with BMI of 50.0-59.9, adult (HCC) 10/15/2020   HYPERLIPIDEMIA 09/05/2008   Essential hypertension 09/05/2008   Coronary atherosclerosis 09/05/2008   Cardiac dysrhythmia 09/05/2008   Past Medical History:  Diagnosis Date   Anxiety    Arthritis    Cancer (HCC)    melanoma removed (mole on stomach)   Coronary artery disease    Hypercholesterolemia    Hypertension    Obesity    Osteoarthritis    PONV (postoperative nausea and vomiting)    Sleep apnea     Family History  Problem Relation Age of Onset   Diabetes Father    Arthritis Father    Hypertension Father    Hypercalcemia Maternal Grandfather     Past Surgical History:  Procedure Laterality Date   BARIATRIC SURGERY     BIOPSY  08/09/2020   Procedure:  BIOPSY;  Surgeon: Lyndel Deward PARAS, MD;  Location: [facility];  Service: General;;   CARDIAC SURGERY  2005   stent   ESOPHAGOGASTRODUODENOSCOPY N/A 08/09/2020   Procedure: ESOPHAGOGASTRODUODENOSCOPY (EGD);  Surgeon: Lyndel Deward PARAS, MD;  Location: [facility];  Service: General;  Laterality: N/A;   TOTAL KNEE ARTHROPLASTY Right 04/25/2021   Procedure: RIGHT TOTAL KNEE ARTHROPLASTY;  Surgeon: Harden Jerona GAILS, MD;  Location: [facility] OR;  Service: Orthopedics;  Laterality: Right;   TOTAL KNEE ARTHROPLASTY Left 01/14/2024   Procedure: ARTHROPLASTY, KNEE, TOTAL;  Surgeon: Harden Jerona GAILS, MD;  Location: [facility] OR;  Service: Orthopedics;  Laterality: Left;   UPPER GI ENDOSCOPY N/A 10/15/2020   Procedure: UPPER GI ENDOSCOPY;  Surgeon: Lyndel Deward PARAS, MD;  Location: [facility];  Service: General;  Laterality: N/A;   Social History   Occupational History   Not on file  Tobacco Use   Smoking  status: Never   Smokeless tobacco: Never  Vaping Use   Vaping status: Never Used  Substance and Sexual Activity   Alcohol  use: Not Currently   Drug use: No   Sexual activity: Not Currently       "

## 2024-01-17 NOTE — Telephone Encounter (Signed)
 Patient aware

## 2024-01-17 NOTE — Telephone Encounter (Signed)
 Appt today at 2 pm with Deland

## 2024-01-17 NOTE — Telephone Encounter (Signed)
 Patient called this morning and states he was discharged over the weekend (L-TKR on Friday) and dressing filled with blood. Called on-call and PA met him at office and changed dressing and added a steri-strip. Dressing has refilled with blood and he feels he needs to come in and have it changed or looked at again. Therapy supposed to come out today, but doesn't want to until dressing and incision looked at to make sure OK. Can we get him in today? Thanks.

## 2024-01-19 ENCOUNTER — Telehealth: Payer: Self-pay | Admitting: *Deleted

## 2024-01-19 ENCOUNTER — Other Ambulatory Visit: Payer: Self-pay | Admitting: Physician Assistant

## 2024-01-19 ENCOUNTER — Encounter: Payer: Self-pay | Admitting: Physician Assistant

## 2024-01-19 DIAGNOSIS — Z96652 Presence of left artificial knee joint: Secondary | ICD-10-CM

## 2024-01-19 MED ORDER — OXYCODONE-ACETAMINOPHEN 5-325 MG PO TABS: 1.0000 | ORAL_TABLET | ORAL | 0 refills | Status: DC | PRN

## 2024-01-19 NOTE — Telephone Encounter (Signed)
 Patient called and will need a refill of pain medication please. Thank you.

## 2024-01-19 NOTE — Progress Notes (Signed)
 "  Office Visit Note   Patient: Nathan Pope           Date of Birth: Apr 27, 1960           MRN: [id number] Visit Date: 01/17/2024              Requested by: Toribio Jerel MATSU, MD [address] [place],  [place] [address] PCP: Toribio Jerel MATSU, MD  Chief Complaint  Patient presents with   Left Knee - Routine Post Op    01/14/2024 left total knee replacement       HPI: 64 y/o male with recent left Total knee arthroplasty.  He is here for his first post op visit.  He is doing well over all.  He is in PT and his pain is well controlled.    Assessment & Plan: Visit Diagnoses:  1. Status post left knee replacement     Plan: Continue PT Dry dressing with ace wrap as needed.   Follow-Up Instructions: Return in about 9 days (around 01/26/2024).   Ortho Exam  Patient is alert, oriented, no adenopathy, well-dressed, normal affect, normal respiratory effort. On examination left knee the edema is well-controlled her incision is well-healed there is no gaping or drainage.  Dressing removed.      Imaging: No results found. No images are attached to the encounter.  Labs: No results found for: HGBA1C, ESRSEDRATE, CRP, LABURIC, REPTSTATUS, GRAMSTAIN, CULT, LABORGA   Lab Results  Component Value Date   ALBUMIN 4.3 10/15/2020    No results found for: MG No results found for: VD25OH  No results found for: PREALBUMIN    Latest Ref Rng & Units 01/10/2024   10:20 AM 04/16/2021    9:40 AM 10/16/2020    4:28 AM  CBC EXTENDED  WBC 4.0 - 10.5 K/uL 6.4  5.7  12.0   RBC 4.22 - 5.81 MIL/uL 4.72  4.34  4.27   Hemoglobin 13.0 - 17.0 g/dL 84.5  86.1  86.2   HCT 39.0 - 52.0 % 45.7  41.8  39.4   Platelets 150 - 400 K/uL 210  221  PLATELET CLUMPS NOTED ON SMEAR, COUNT APPEARS DECREASED   NEUT# 1.7 - 7.7 K/uL   10.4   Lymph# 0.7 - 4.0 K/uL   0.9      There is no height or weight on file to calculate BMI.  Orders:  No orders of the defined types were placed in this  encounter.  No orders of the defined types were placed in this encounter.    Procedures: No procedures performed  Clinical Data: No additional findings.  ROS:  All other systems negative, except as noted in the HPI. Review of Systems  Objective: Vital Signs: There were no vitals taken for this visit.  Specialty Comments:  No specialty comments available.  PMFS History: Patient Active Problem List   Diagnosis Date Noted   Arthritis of left knee 01/14/2024   Osteoarthritis of left knee, unspecified osteoarthritis type 01/14/2024   Total knee replacement status, right 04/25/2021   Unilateral primary osteoarthritis, right knee    Morbid obesity with BMI of 50.0-59.9, adult (HCC) 10/15/2020   HYPERLIPIDEMIA 09/05/2008   Essential hypertension 09/05/2008   Coronary atherosclerosis 09/05/2008   Cardiac dysrhythmia 09/05/2008   Past Medical History:  Diagnosis Date   Anxiety    Arthritis    Cancer (HCC)    melanoma removed (mole on stomach)   Coronary artery disease    Hypercholesterolemia  Hypertension    Obesity    Osteoarthritis    PONV (postoperative nausea and vomiting)    Sleep apnea     Family History  Problem Relation Age of Onset   Diabetes Father    Arthritis Father    Hypertension Father    Hypercalcemia Maternal Grandfather     Past Surgical History:  Procedure Laterality Date   BARIATRIC SURGERY     BIOPSY  08/09/2020   Procedure: BIOPSY;  Surgeon: Lyndel Deward PARAS, MD;  Location: [facility];  Service: General;;   CARDIAC SURGERY  2005   stent   ESOPHAGOGASTRODUODENOSCOPY N/A 08/09/2020   Procedure: ESOPHAGOGASTRODUODENOSCOPY (EGD);  Surgeon: Lyndel Deward PARAS, MD;  Location: [facility];  Service: General;  Laterality: N/A;   TOTAL KNEE ARTHROPLASTY Right 04/25/2021   Procedure: RIGHT TOTAL KNEE ARTHROPLASTY;  Surgeon: Harden Jerona GAILS, MD;  Location: [facility];  Service: Orthopedics;  Laterality: Right;   TOTAL KNEE ARTHROPLASTY Left  01/14/2024   Procedure: ARTHROPLASTY, KNEE, TOTAL;  Surgeon: Harden Jerona GAILS, MD;  Location: [facility] OR;  Service: Orthopedics;  Laterality: Left;   UPPER GI ENDOSCOPY N/A 10/15/2020   Procedure: UPPER GI ENDOSCOPY;  Surgeon: Lyndel Deward PARAS, MD;  Location: [facility];  Service: General;  Laterality: N/A;   Social History   Occupational History   Not on file  Tobacco Use   Smoking status: Never   Smokeless tobacco: Never  Vaping Use   Vaping status: Never Used  Substance and Sexual Activity   Alcohol  use: Not Currently   Drug use: No   Sexual activity: Not Currently       "

## 2024-01-25 ENCOUNTER — Telehealth: Payer: Self-pay | Admitting: *Deleted

## 2024-01-25 DIAGNOSIS — Z96652 Presence of left artificial knee joint: Secondary | ICD-10-CM

## 2024-01-25 MED ORDER — OXYCODONE-ACETAMINOPHEN 5-325 MG PO TABS: 1.0000 | ORAL_TABLET | ORAL | 0 refills | Status: DC | PRN

## 2024-01-25 NOTE — Telephone Encounter (Signed)
 Patient aware of refill sent

## 2024-01-25 NOTE — Telephone Encounter (Signed)
 Patient called requesting refill of pain medication. Thank you.

## 2024-01-26 ENCOUNTER — Ambulatory Visit: Admitting: Physician Assistant

## 2024-01-26 ENCOUNTER — Encounter: Payer: Self-pay | Admitting: Physician Assistant

## 2024-01-26 ENCOUNTER — Other Ambulatory Visit: Payer: Self-pay

## 2024-01-26 DIAGNOSIS — Z96652 Presence of left artificial knee joint: Secondary | ICD-10-CM

## 2024-01-26 NOTE — Progress Notes (Signed)
 "  Office Visit Note   Patient: Nathan Pope           Date of Birth: October 21, 1960           MRN: [id number] Visit Date: 01/26/2024              Requested by: Toribio Jerel MATSU, MD [address] [place],  [place] [address] PCP: Toribio Jerel MATSU, MD  Chief Complaint  Patient presents with   Left Knee - Routine Post Op    01/14/24 Left TKA      HPI: 64 y/o male with s week history of left TKA.  He is taking pain medication 4-6 per day depending on PT schedule.  He is elevating in a recliner, and using his ice machine.  He has no new complaints and feels he is doing well.    Assessment & Plan: Visit Diagnoses:  1. Status post left knee replacement     Plan: 2 weeks post left TKA His staples were removed today he tolerated this well.  We discussed the need for elevation with the LE above the heart level.  He does have a history of chronic venous insufficiency with Brawny skin changes.   He will benefit from compression socks in the future.   He will continue to work with PT.    Follow-Up Instructions: Return in about 2 weeks (around 02/09/2024).   Ortho Exam  Patient is alert, oriented, no adenopathy, well-dressed, normal affect, normal respiratory effort. Left LE edema, most notably in the left calf.  Ecchymosis in the knee and calf.  The knee incision is well healed with good skin approximation.     Left TKA x ray demonstrates no lucency around the components.  Stable left TKA.    Imaging:   Labs: No results found for: HGBA1C, ESRSEDRATE, CRP, LABURIC, REPTSTATUS, GRAMSTAIN, CULT, LABORGA   Lab Results  Component Value Date   ALBUMIN 4.3 10/15/2020    No results found for: MG No results found for: VD25OH  No results found for: PREALBUMIN    Latest Ref Rng & Units 01/10/2024   10:20 AM 04/16/2021    9:40 AM 10/16/2020    4:28 AM  CBC EXTENDED  WBC 4.0 - 10.5 K/uL 6.4  5.7  12.0   RBC 4.22 - 5.81 MIL/uL 4.72  4.34  4.27   Hemoglobin 13.0 -  17.0 g/dL 84.5  86.1  86.2   HCT 39.0 - 52.0 % 45.7  41.8  39.4   Platelets 150 - 400 K/uL 210  221  PLATELET CLUMPS NOTED ON SMEAR, COUNT APPEARS DECREASED   NEUT# 1.7 - 7.7 K/uL   10.4   Lymph# 0.7 - 4.0 K/uL   0.9      There is no height or weight on file to calculate BMI.  Orders:  Orders Placed This Encounter  Procedures   XR Knee 1-2 Views Left   No orders of the defined types were placed in this encounter.    Procedures: No procedures performed  Clinical Data: No additional findings.  ROS:  All other systems negative, except as noted in the HPI. Review of Systems  Objective: Vital Signs: There were no vitals taken for this visit.  Specialty Comments:  No specialty comments available.  PMFS History: Patient Active Problem List   Diagnosis Date Noted   Arthritis of left knee 01/14/2024   Osteoarthritis of left knee, unspecified osteoarthritis type 01/14/2024   Total knee replacement status,  right 04/25/2021   Unilateral primary osteoarthritis, right knee    Morbid obesity with BMI of 50.0-59.9, adult (HCC) 10/15/2020   HYPERLIPIDEMIA 09/05/2008   Essential hypertension 09/05/2008   Coronary atherosclerosis 09/05/2008   Cardiac dysrhythmia 09/05/2008   Past Medical History:  Diagnosis Date   Anxiety    Arthritis    Cancer (HCC)    melanoma removed (mole on stomach)   Coronary artery disease    Hypercholesterolemia    Hypertension    Obesity    Osteoarthritis    PONV (postoperative nausea and vomiting)    Sleep apnea     Family History  Problem Relation Age of Onset   Diabetes Father    Arthritis Father    Hypertension Father    Hypercalcemia Maternal Grandfather     Past Surgical History:  Procedure Laterality Date   BARIATRIC SURGERY     BIOPSY  08/09/2020   Procedure: BIOPSY;  Surgeon: Lyndel Deward PARAS, MD;  Location: [facility];  Service: General;;   CARDIAC SURGERY  2005   stent   ESOPHAGOGASTRODUODENOSCOPY N/A 08/09/2020    Procedure: ESOPHAGOGASTRODUODENOSCOPY (EGD);  Surgeon: Lyndel Deward PARAS, MD;  Location: [facility];  Service: General;  Laterality: N/A;   TOTAL KNEE ARTHROPLASTY Right 04/25/2021   Procedure: RIGHT TOTAL KNEE ARTHROPLASTY;  Surgeon: Harden Jerona GAILS, MD;  Location: [facility] OR;  Service: Orthopedics;  Laterality: Right;   TOTAL KNEE ARTHROPLASTY Left 01/14/2024   Procedure: ARTHROPLASTY, KNEE, TOTAL;  Surgeon: Harden Jerona GAILS, MD;  Location: [facility] OR;  Service: Orthopedics;  Laterality: Left;   UPPER GI ENDOSCOPY N/A 10/15/2020   Procedure: UPPER GI ENDOSCOPY;  Surgeon: Lyndel Deward PARAS, MD;  Location: [facility];  Service: General;  Laterality: N/A;   Social History   Occupational History   Not on file  Tobacco Use   Smoking status: Never   Smokeless tobacco: Never  Vaping Use   Vaping status: Never Used  Substance and Sexual Activity   Alcohol  use: Not Currently   Drug use: No   Sexual activity: Not Currently       "

## 2024-01-28 ENCOUNTER — Other Ambulatory Visit: Payer: Self-pay | Admitting: Physician Assistant

## 2024-01-28 ENCOUNTER — Telehealth: Payer: Self-pay | Admitting: *Deleted

## 2024-01-28 DIAGNOSIS — Z96652 Presence of left artificial knee joint: Secondary | ICD-10-CM

## 2024-01-28 MED ORDER — OXYCODONE-ACETAMINOPHEN 5-325 MG PO TABS: 1.0000 | ORAL_TABLET | Freq: Four times a day (QID) | ORAL | 0 refills | Status: DC | PRN

## 2024-01-28 MED ORDER — OXYCODONE-ACETAMINOPHEN 5-325 MG PO TABS: 1.0000 | ORAL_TABLET | Freq: Four times a day (QID) | ORAL | 0 refills | Status: AC | PRN

## 2024-01-28 NOTE — Telephone Encounter (Signed)
 Patient called and wanted to get a new refill of his pain medication before the bad weather comes in. He will be out on Sunday and the pharmacy won't be able to fill until Saturday. Pharmacy on file. Thank you.

## 2024-02-04 ENCOUNTER — Telehealth: Payer: Self-pay | Admitting: *Deleted

## 2024-02-04 DIAGNOSIS — Z96652 Presence of left artificial knee joint: Secondary | ICD-10-CM

## 2024-02-04 MED ORDER — OXYCODONE-ACETAMINOPHEN 5-325 MG PO TABS: 1.0000 | ORAL_TABLET | Freq: Four times a day (QID) | ORAL | 0 refills | Status: DC | PRN

## 2024-02-04 NOTE — Telephone Encounter (Signed)
 Patient requested refill of pain medication prior to the weekend. If we are closed, will run out by early next week. Thank you.

## 2024-02-11 ENCOUNTER — Other Ambulatory Visit: Payer: Self-pay | Admitting: Family

## 2024-02-11 ENCOUNTER — Telehealth: Payer: Self-pay | Admitting: *Deleted

## 2024-02-11 DIAGNOSIS — Z96652 Presence of left artificial knee joint: Secondary | ICD-10-CM

## 2024-02-11 MED ORDER — OXYCODONE-ACETAMINOPHEN 5-325 MG PO TABS: 1.0000 | ORAL_TABLET | Freq: Four times a day (QID) | ORAL | 0 refills | Status: AC | PRN

## 2024-02-11 NOTE — Telephone Encounter (Signed)
 Patient called requesting refill of his pain medication. Left Total knee on 01/14/24. Working with [facility]. Has enough today and tomorrow, but will be out on Sunday. Thank you.

## 2024-02-16 ENCOUNTER — Ambulatory Visit: Admitting: Physician Assistant
